# Patient Record
Sex: Female | Born: 1935 | Race: White | Hispanic: No | Smoking: Never smoker
Health system: Southern US, Community
[De-identification: ages and names within clinical notes are randomized; demographics above are authoritative.]

## PROBLEM LIST (undated history)

## (undated) DIAGNOSIS — I499 Cardiac arrhythmia, unspecified: Secondary | ICD-10-CM

## (undated) DIAGNOSIS — I509 Heart failure, unspecified: Secondary | ICD-10-CM

## (undated) DIAGNOSIS — F32A Depression, unspecified: Secondary | ICD-10-CM

## (undated) DIAGNOSIS — I10 Essential (primary) hypertension: Secondary | ICD-10-CM

---

## 2020-09-06 ENCOUNTER — Other Ambulatory Visit: Payer: Self-pay

## 2020-09-06 ENCOUNTER — Emergency Department: Payer: Medicare HMO

## 2020-09-06 ENCOUNTER — Emergency Department
Admission: EM | Admit: 2020-09-06 | Discharge: 2020-09-06 | Disposition: A | Discharge status: Home / Self Care | Payer: Medicare HMO | Attending: Emergency Medicine | Admitting: Emergency Medicine

## 2020-09-06 DIAGNOSIS — I480 Paroxysmal atrial fibrillation: Secondary | ICD-10-CM | POA: Insufficient documentation

## 2020-09-06 DIAGNOSIS — R0789 Other chest pain: Secondary | ICD-10-CM | POA: Diagnosis present

## 2020-09-06 LAB — CBC
HCT: 43.3 % (ref 36.0–46.0)
Hemoglobin: 14.2 g/dL (ref 12.0–15.0)
MCH: 30.4 pg (ref 26.0–34.0)
MCHC: 32.8 g/dL (ref 30.0–36.0)
MCV: 92.7 fL (ref 80.0–100.0)
Platelets: 144 10*3/uL — ABNORMAL LOW (ref 150–400)
RBC: 4.67 MIL/uL (ref 3.87–5.11)
RDW: 13.2 % (ref 11.5–15.5)
WBC: 6.3 10*3/uL (ref 4.0–10.5)
nRBC: 0 % (ref 0.0–0.2)

## 2020-09-06 LAB — TROPONIN I (HIGH SENSITIVITY)
Troponin I (High Sensitivity): 39 ng/L — ABNORMAL HIGH (ref <18)
Troponin I (High Sensitivity): 37 ng/L — ABNORMAL HIGH (ref <18)

## 2020-09-06 LAB — BASIC METABOLIC PANEL
Anion gap: 9 (ref 5–15)
BUN: 15 mg/dL (ref 8–23)
CO2: 28 mmol/L (ref 22–32)
Calcium: 9.4 mg/dL (ref 8.9–10.3)
Chloride: 104 mmol/L (ref 98–111)
Creatinine, Ser: 0.52 mg/dL (ref 0.44–1.00)
GFR, Estimated: >60 mL/min (ref >60)
Glucose, Bld: 116 mg/dL — ABNORMAL HIGH (ref 70–99)
Potassium: 4.1 mmol/L (ref 3.5–5.1)
Sodium: 141 mmol/L (ref 135–145)

## 2020-09-06 NOTE — ED Triage Notes (Signed)
Pt come with c/o CP and SOB. Pt states this started today. Pt states she was trying to get dressed and couldn't breath

## 2020-09-06 NOTE — ED Provider Notes (Signed)
[facility] Emergency Department Provider Note  ____________________________________________  Time seen: Approximately 10:44 PM  I have reviewed the triage vital signs and the nursing notes.   HISTORY  Chief Complaint Shortness of Breath    HPI Alexandria Goodman is a 85 y.o. female with a past history of CHF, paroxysmal atrial fibrillation who comes ED complaining of intermittent episodes of chest tightness and shortness of breath over the past several days.  Earlier today it was more intense so she came to the ED, but she reports it has resolved and she feels back to normal, no chest pain or shortness of breath currently.  Denies any recent exertional symptoms.  No dyspnea on exertion or orthopnea.  She does take her torsemide intermittently, has not taken her carvedilol since yesterday.  Had been off of Eliquis for stroke prevention but just received it in the mail today .  Reports that she checks her weight every day.  Her weight has been at her usual range of 190 to 194 pounds.  In the past with significant CHF symptoms she is weight is much is 210 pounds.     History reviewed. No pertinent past medical history.   There are no problems to display for this patient.    History reviewed. No pertinent surgical history.   Prior to Admission medications   Not on File     Allergies Patient has no allergy information on record.   No family history on file.  Social History    Review of Systems  Constitutional:   No fever or chills.  ENT:   No sore throat. No rhinorrhea. Cardiovascular: Positive chest pain without syncope. Respiratory: Positive shortness of breath without cough. Gastrointestinal:   Negative for abdominal pain, vomiting and diarrhea.  Musculoskeletal:   Chronic lower extremity swelling All other systems reviewed and are negative except as documented above in ROS and  HPI.  ____________________________________________   PHYSICAL EXAM:  VITAL SIGNS: ED Triage Vitals [09/06/20 1724]  Enc Vitals Group     BP (!) 152/73     Pulse Rate 86     Resp 18     Temp 98.5 F (36.9 C)     Temp src      SpO2 100 %     Weight      Height      Head Circumference      Peak Flow      Pain Score 7     Pain Loc      Pain Edu?      Excl. in GC?     Vital signs reviewed, nursing assessments reviewed.   Constitutional:   Alert and oriented. Non-toxic appearance. Eyes:   Conjunctivae are normal. EOMI. PERRL. ENT      Head:   Normocephalic and atraumatic.      Nose:   Wearing a mask.      Mouth/Throat:   Wearing a mask.      Neck:   No meningismus. Full ROM. Hematological/Lymphatic/Immunilogical:   No cervical lymphadenopathy. Cardiovascular:   RRR. Symmetric bilateral radial and DP pulses.  No murmurs. Cap refill less than 2 seconds. Respiratory:   Normal respiratory effort without tachypnea/retractions. Breath sounds are clear and equal bilaterally. No wheezes/rales/rhonchi. Gastrointestinal:   Soft and nontender. Non distended. There is no CVA tenderness.  No rebound, rigidity, or guarding. Genitourinary:   deferred Musculoskeletal:   Normal range of motion in all extremities. No joint effusions.  No lower extremity tenderness.  Trace pitting edema bilateral lower extremities.  Symmetric calf circumference. Neurologic:   Normal speech and language.  Motor grossly intact. No acute focal neurologic deficits are appreciated.  Skin:    Skin is warm, dry and intact. No rash noted.  No petechiae, purpura, or bullae.  ____________________________________________    LABS (pertinent positives/negatives) (all labs ordered are listed, but only abnormal results are displayed) Labs Reviewed  BASIC METABOLIC PANEL - Abnormal; Notable for the following components:      Result Value   Glucose, Bld 116 (*)    All other components within normal limits  CBC -  Abnormal; Notable for the following components:   Platelets 144 (*)    All other components within normal limits  TROPONIN I (HIGH SENSITIVITY) - Abnormal; Notable for the following components:   Troponin I (High Sensitivity) 39 (*)    All other components within normal limits  TROPONIN I (HIGH SENSITIVITY) - Abnormal; Notable for the following components:   Troponin I (High Sensitivity) 37 (*)    All other components within normal limits   ____________________________________________   EKG  Interpreted by me Normal sinus rhythm rate of 81.  Normal axis and intervals.  Poor R wave progression.  Normal ST segments and T waves.  No ischemic changes.  ____________________________________________    RADIOLOGY  DG Chest 2 View  Result Date: 09/06/2020 CLINICAL DATA:  85 year old female with chest pain. EXAM: CHEST - 2 VIEW COMPARISON:  None FINDINGS: Small bilateral pleural effusions with associated bibasilar atelectasis. Pneumonia is not excluded. Clinical correlation is recommended. There is chronic interstitial coarsening and mild bronchitic changes. No pneumothorax. Mild cardiomegaly. No acute osseous pathology. IMPRESSION: Small bilateral pleural effusions and bibasilar atelectasis versus infiltrate. Electronically Signed   By: Elgie Collard M.D.   On: 09/06/2020 18:04    ____________________________________________   PROCEDURES Procedures  ____________________________________________  DIFFERENTIAL DIAGNOSIS   Paroxysmal atrial fibrillation, pulmonary edema, pleural effusion, non-STEMI, GERD  CLINICAL IMPRESSION / ASSESSMENT AND PLAN / ED COURSE  Medications ordered in the ED: Medications - No data to display  Pertinent labs & imaging results that were available during my care of the patient were reviewed by me and considered in my medical decision making (see chart for details).  Alexandria Goodman was evaluated in Emergency Department on 09/06/2020 for the symptoms  described in the history of present illness. She was evaluated in the context of the global COVID-19 pandemic, which necessitated consideration that the patient might be at risk for infection with the SARS-CoV-2 virus that causes COVID-19. Institutional protocols and algorithms that pertain to the evaluation of patients at risk for COVID-19 are in a state of rapid change based on information released by regulatory bodies including the CDC and federal and state organizations. These policies and algorithms were followed during the patient's care in the ED.   Patient presents with atypical chest symptoms.  Not exertional.  Due to medication noncompliance, I suspect paroxysmal atrial fibrillation which has since resolved.  She has her medications at home including Eliquis and will resume them.  Encouraged her to follow-up with heart failure clinic for continued monitoring of her medication regimen and symptoms since she has just moved to the [facility] area and currently does not have a cardiologist in the area.      ____________________________________________   FINAL CLINICAL IMPRESSION(S) / ED DIAGNOSES    Final diagnoses:  Atypical chest pain  Paroxysmal atrial fibrillation [facility])     ED Discharge Orders  None      Portions of this note were generated with dragon dictation software. Dictation errors may occur despite best attempts at proofreading.   Sharman Cheek, MD 09/06/20 2248

## 2020-09-06 NOTE — ED Triage Notes (Signed)
Emergency Medicine Provider Triage Evaluation Note  Alexandria Goodman , a 85 y.o. female  was evaluated in triage.  Pt complains of chest pain and shortness of breath. Symptoms started after she became very upset last night. She believes she is in a-fib. No chest pain currently.  Review of Systems  Positive: Shortness of breath Negative: Chest pain currently.  Physical Exam  There were no vitals taken for this visit. Gen:   Awake, no distress  Speaking in full sentences. HEENT:  Atraumatic  Resp:  Normal effort  Cardiac:  Normal rate  Abd:   Nondistended, nontender  MSK:   Moves extremities without difficulty  Neuro:  Speech clear   Medical Decision Making  Medically screening exam initiated at 5:21 PM.  Appropriate orders placed.  Alexandria Goodman was informed that the remainder of the evaluation will be completed by another provider, this initial triage assessment does not replace that evaluation, and the importance of remaining in the ED until their evaluation is complete.  Clinical Impression   Anxiety   Chinita Pester, FNP 09/06/20 Lora Paula, MD 09/06/20 2244

## 2020-09-06 NOTE — ED Triage Notes (Signed)
Pt comes into the ED via EMS from home with c/o having chest pain after having an argument with a family member today. Non compliant with home meds,   HR100 100%RA 170/90 [id number]

## 2020-09-06 NOTE — ED Notes (Signed)
Pt verbalized understanding of d/c instructions at this time. Pt denies further questions. Pt assisted to car by wheelchair by this RN. Pt in NAD at this time.

## 2020-09-06 NOTE — Discharge Instructions (Addendum)
Please continue to take all of your medications as prescribed and follow up with the heart failure clinic

## 2020-09-07 ENCOUNTER — Telehealth: Payer: Self-pay | Admitting: Family

## 2020-09-07 NOTE — Telephone Encounter (Signed)
LVM in attempt to schedule patient with a new patient CHF Clinic appointment after receiving a referral from the ED.   Vinette Crites, NT

## 2020-10-18 ENCOUNTER — Emergency Department
Admission: EM | Admit: 2020-10-18 | Discharge: 2020-10-18 | Disposition: A | Discharge status: Home / Self Care | Payer: Medicare HMO | Attending: Emergency Medicine | Admitting: Emergency Medicine

## 2020-10-18 ENCOUNTER — Emergency Department: Payer: Medicare HMO

## 2020-10-18 ENCOUNTER — Other Ambulatory Visit: Payer: Self-pay

## 2020-10-18 DIAGNOSIS — W1809XA Striking against other object with subsequent fall, initial encounter: Secondary | ICD-10-CM | POA: Insufficient documentation

## 2020-10-18 DIAGNOSIS — M25531 Pain in right wrist: Secondary | ICD-10-CM | POA: Diagnosis not present

## 2020-10-18 DIAGNOSIS — M79641 Pain in right hand: Secondary | ICD-10-CM | POA: Diagnosis not present

## 2020-10-18 DIAGNOSIS — R0781 Pleurodynia: Secondary | ICD-10-CM | POA: Insufficient documentation

## 2020-10-18 DIAGNOSIS — S0990XA Unspecified injury of head, initial encounter: Secondary | ICD-10-CM | POA: Insufficient documentation

## 2020-10-18 DIAGNOSIS — W19XXXA Unspecified fall, initial encounter: Secondary | ICD-10-CM

## 2020-10-18 NOTE — ED Provider Notes (Signed)
[facility]-EMERGENCY DEPARTMENT  ____________________________________________  Time seen: Approximately 4:18 PM  I have reviewed the triage vital signs and the nursing notes.   HISTORY  Chief Complaint Fall   Historian Patient     HPI Alexandria Goodman is a 85 y.o. female presents to the emergency department after a mechanical fall.  Patient was at her dentist office when she tripped on a rug.  She does report that she hit her head but does not recall loss of consciousness.  She is having right wrist pain and right hand pain as well as right-sided rib pain.  She denies chest tightness, shortness of breath, nausea, vomiting or abdominal pain.  She has been able to ambulate since injury occurred.  No abrasions or lacerations.   History reviewed. No pertinent past medical history.   Immunizations up to date:  Yes.     History reviewed. No pertinent past medical history.  There are no problems to display for this patient.   History reviewed. No pertinent surgical history.  Prior to Admission medications   Not on File    Allergies Patient has no allergy information on record.  History reviewed. No pertinent family history.  Social History Social History   Tobacco Use  . Smoking status: Unknown If Ever Smoked     Review of Systems  Constitutional: No fever/chills Eyes:  No discharge ENT: No upper respiratory complaints. Respiratory: no cough. No SOB/ use of accessory muscles to breath Gastrointestinal:   No nausea, no vomiting.  No diarrhea.  No constipation. Musculoskeletal: Patient has right wrist and hand pain.  Skin: Negative for rash, abrasions, lacerations, ecchymosis.   ____________________________________________   PHYSICAL EXAM:  VITAL SIGNS: ED Triage Vitals  Enc Vitals Group     BP 10/18/20 1545 (!) 141/65     Pulse Rate 10/18/20 1545 71     Resp 10/18/20 1545 18     Temp 10/18/20 1545 98.2 F (36.8 C)     Temp Source 10/18/20 1545 Oral      SpO2 10/18/20 1545 98 %     Weight 10/18/20 1546 188 lb (85.3 kg)     Height 10/18/20 1546 4\' 6"  (1.372 m)     Head Circumference --      Peak Flow --      Pain Score 10/18/20 1546 4     Pain Loc --      Pain Edu? --      Excl. in GC? --      Constitutional: Alert and oriented. Well appearing and in no acute distress. Eyes: Conjunctivae are normal. PERRL. EOMI. Head: Atraumatic. ENT:      Nose: No congestion/rhinnorhea.      Mouth/Throat: Mucous membranes are moist.  Neck: No stridor.  Full range of motion.  No midline C-spine tenderness to palpation. Cardiovascular: Normal rate, regular rhythm. Normal S1 and S2.  Good peripheral circulation. Respiratory: Normal respiratory effort without tachypnea or retractions. Lungs CTAB. Good air entry to the bases with no decreased or absent breath sounds Gastrointestinal: Bowel sounds x 4 quadrants. Soft and nontender to palpation. No guarding or rigidity. No distention. Musculoskeletal: Patient performs full range of motion at the right wrist.  She does have some minimal soft tissue swelling along the dorsal aspect of the right wrist and hand. Neurologic:  Normal for age. No gross focal neurologic deficits are appreciated.  Skin:  Skin is warm, dry and intact. No rash noted. Psychiatric: Mood and affect are normal for age. Speech and behavior  are normal.   ____________________________________________   LABS (all labs ordered are listed, but only abnormal results are displayed)  Labs Reviewed - No data to display ____________________________________________  EKG   ____________________________________________  RADIOLOGY Geraldo Pitter, personally viewed and evaluated these images (plain radiographs) as part of my medical decision making, as well as reviewing the written report by the radiologist.    DG Ribs Unilateral W/Chest Right  Result Date: 10/18/2020 CLINICAL DATA:  Recent fall with right-sided rib pain, initial encounter  EXAM: RIGHT RIBS AND CHEST - 3+ VIEW COMPARISON:  09/06/2020 FINDINGS: Cardiac shadow is enlarged but stable. Aortic calcifications are again seen. The lungs are clear without focal infiltrate or sizable effusion. No acute fracture is identified. IMPRESSION: No evidence of acute right rib fracture. Electronically Signed   By: Alcide Clever M.D.   On: 10/18/2020 17:03   DG Wrist Complete Right  Result Date: 10/18/2020 CLINICAL DATA:  Recent fall with wrist pain, initial encounter EXAM: RIGHT WRIST - COMPLETE 3+ VIEW COMPARISON:  None. FINDINGS: No acute fracture or dislocation is noted. Degenerative changes are noted in the first [facility] joint as well as in the lateral aspect of the second carpal row. No soft tissue abnormality is seen. IMPRESSION: Degenerative change without acute abnormality. Electronically Signed   By: Alcide Clever M.D.   On: 10/18/2020 17:02   CT Head Wo Contrast  Result Date: 10/18/2020 CLINICAL DATA:  Status post fall. EXAM: CT HEAD WITHOUT CONTRAST TECHNIQUE: Contiguous axial images were obtained from the base of the skull through the vertex without intravenous contrast. COMPARISON:  None. FINDINGS: Brain: There is mild cerebral atrophy with widening of the extra-axial spaces and ventricular dilatation. There are areas of decreased attenuation within the white matter tracts of the supratentorial brain, consistent with microvascular disease changes. Small, chronic bilateral basal ganglia lacunar infarcts are noted. Vascular: No hyperdense vessel or unexpected calcification. Skull: Normal. Negative for fracture or focal lesion. Sinuses/Orbits: Mild bilateral ethmoid sinus mucosal thickening is seen. Other: None. IMPRESSION: 1. Mild generalized cerebral atrophy. 2. Small, chronic bilateral basal ganglia lacunar infarcts. 3. No acute intracranial abnormality. Electronically Signed   By: Aram Candela M.D.   On: 10/18/2020 16:42   CT Cervical Spine Wo Contrast  Result Date:  10/18/2020 CLINICAL DATA:  Status post fall. EXAM: CT CERVICAL SPINE WITHOUT CONTRAST TECHNIQUE: Multidetector CT imaging of the cervical spine was performed without intravenous contrast. Multiplanar CT image reconstructions were also generated. COMPARISON:  None. FINDINGS: Alignment: Normal. Skull base and vertebrae: No acute fracture. No primary bone lesion or focal pathologic process. Soft tissues and spinal canal: No prevertebral fluid or swelling. No visible canal hematoma. Disc levels: Moderate to marked severity endplate sclerosis is seen at the levels of C4-C5, C5-C6 and C6-C7. Mild endplate sclerosis is seen at the levels of C3-C4 and C7-T1. There is moderate severity narrowing of the anterior atlantoaxial articulation. Marked severity intervertebral disc space narrowing is seen at the levels of C3-C4, C4-C5, C5-C6 and C6-C7. Moderate severity intervertebral disc space narrowing is seen at the level of C7-T1. Bilateral moderate severity multilevel facet joint hypertrophy is noted. Upper chest: Negative. Other: None. IMPRESSION: 1. Marked severity multilevel degenerative changes, as described above. 2. No evidence of an acute fracture or subluxation. Electronically Signed   By: Aram Candela M.D.   On: 10/18/2020 16:45   DG Hand Complete Right  Result Date: 10/18/2020 CLINICAL DATA:  Recent fall with hand pain, initial encounter EXAM: RIGHT HAND - COMPLETE  3+ VIEW COMPARISON:  None. FINDINGS: Mild degenerative changes are noted in the carpal bones. Additionally interphalangeal degenerative changes are seen. No acute fracture or dislocation is noted. No soft tissue abnormality is seen. IMPRESSION: Mild degenerative change without acute abnormality. Electronically Signed   By: Alcide Clever M.D.   On: 10/18/2020 17:04    ____________________________________________    PROCEDURES  Procedure(s) performed:     Procedures     Medications - No data to  display   ____________________________________________   INITIAL IMPRESSION / ASSESSMENT AND PLAN / ED COURSE  Pertinent labs & imaging results that were available during my care of the patient were reviewed by me and considered in my medical decision making (see chart for details).       Assessment and Plan:  Fall:  85 year old female presents to the emergency department after a mechanical fall.  Vital signs were reassuring at triage.  On physical exam, patient was alert, active and nontoxic-appearing.  Patient had no neuro deficits on exam.  No signs of intracranial bleed or skull fracture on CT of the head or cervical.  There is no bony abnormality on x-rays of the right wrist, right hand or right ribs.  Tylenol was recommended for discomfort.  Return precautions were given to return with new or worsening symptoms.  ____________________________________________  FINAL CLINICAL IMPRESSION(S) / ED DIAGNOSES  Final diagnoses:  Fall, initial encounter      NEW MEDICATIONS STARTED DURING THIS VISIT:  ED Discharge Orders    None          This chart was dictated using voice recognition software/Dragon. Despite best efforts to proofread, errors can occur which can change the meaning. Any change was purely unintentional.     Orvil Feil, PA-C 10/18/20 1859    Phineas Semen, MD 10/18/20 2159

## 2020-10-18 NOTE — ED Triage Notes (Signed)
Patient reported via EMS from [employer] Patient experienced mechanical fall. Reports pain to right wrist and right rib.

## 2020-10-18 NOTE — ED Triage Notes (Signed)
Pt comes into the ED via EMS from [facility], mechanical fall , tripped on carpet, right wrist and right rib pain,  143/66 77HR 96%RA

## 2020-10-18 NOTE — ED Notes (Signed)
Provided discharge instructions. Verbalized understanding. Wheeled to lobby to wait for ride.

## 2020-11-02 ENCOUNTER — Emergency Department: Payer: Medicare HMO

## 2020-11-02 ENCOUNTER — Observation Stay
Admit: 2020-11-02 | Discharge: 2020-11-02 | Disposition: A | Discharge status: Home / Self Care | Payer: Medicare HMO | Attending: Internal Medicine | Admitting: Internal Medicine

## 2020-11-02 ENCOUNTER — Other Ambulatory Visit: Payer: Self-pay

## 2020-11-02 ENCOUNTER — Encounter: Payer: Self-pay | Admitting: Internal Medicine

## 2020-11-02 ENCOUNTER — Observation Stay
Admission: EM | Admit: 2020-11-02 | Discharge: 2020-11-02 | Disposition: A | Discharge status: Home / Self Care | Payer: Medicare HMO | Attending: Internal Medicine | Admitting: Internal Medicine

## 2020-11-02 DIAGNOSIS — I10 Essential (primary) hypertension: Secondary | ICD-10-CM | POA: Diagnosis not present

## 2020-11-02 DIAGNOSIS — Z7901 Long term (current) use of anticoagulants: Secondary | ICD-10-CM | POA: Diagnosis not present

## 2020-11-02 DIAGNOSIS — Z20822 Contact with and (suspected) exposure to covid-19: Secondary | ICD-10-CM | POA: Diagnosis not present

## 2020-11-02 DIAGNOSIS — N183 Chronic kidney disease, stage 3 unspecified: Secondary | ICD-10-CM | POA: Diagnosis not present

## 2020-11-02 DIAGNOSIS — F4325 Adjustment disorder with mixed disturbance of emotions and conduct: Secondary | ICD-10-CM | POA: Diagnosis not present

## 2020-11-02 DIAGNOSIS — I130 Hypertensive heart and chronic kidney disease with heart failure and stage 1 through stage 4 chronic kidney disease, or unspecified chronic kidney disease: Secondary | ICD-10-CM | POA: Diagnosis not present

## 2020-11-02 DIAGNOSIS — R079 Chest pain, unspecified: Secondary | ICD-10-CM

## 2020-11-02 DIAGNOSIS — R7989 Other specified abnormal findings of blood chemistry: Secondary | ICD-10-CM

## 2020-11-02 DIAGNOSIS — Z79899 Other long term (current) drug therapy: Secondary | ICD-10-CM | POA: Diagnosis not present

## 2020-11-02 DIAGNOSIS — I5032 Chronic diastolic (congestive) heart failure: Secondary | ICD-10-CM | POA: Diagnosis not present

## 2020-11-02 DIAGNOSIS — R45851 Suicidal ideations: Secondary | ICD-10-CM

## 2020-11-02 DIAGNOSIS — R259 Unspecified abnormal involuntary movements: Secondary | ICD-10-CM | POA: Diagnosis not present

## 2020-11-02 DIAGNOSIS — F321 Major depressive disorder, single episode, moderate: Secondary | ICD-10-CM | POA: Diagnosis not present

## 2020-11-02 DIAGNOSIS — E66813 Obesity, class 3: Secondary | ICD-10-CM | POA: Diagnosis present

## 2020-11-02 DIAGNOSIS — R778 Other specified abnormalities of plasma proteins: Secondary | ICD-10-CM | POA: Insufficient documentation

## 2020-11-02 DIAGNOSIS — I480 Paroxysmal atrial fibrillation: Secondary | ICD-10-CM | POA: Diagnosis present

## 2020-11-02 DIAGNOSIS — Z046 Encounter for general psychiatric examination, requested by authority: Secondary | ICD-10-CM

## 2020-11-02 DIAGNOSIS — R0789 Other chest pain: Principal | ICD-10-CM | POA: Diagnosis present

## 2020-11-02 DIAGNOSIS — Z7982 Long term (current) use of aspirin: Secondary | ICD-10-CM | POA: Diagnosis not present

## 2020-11-02 DIAGNOSIS — I350 Nonrheumatic aortic (valve) stenosis: Secondary | ICD-10-CM

## 2020-11-02 HISTORY — DX: Heart failure, unspecified: I50.9

## 2020-11-02 HISTORY — DX: Depression, unspecified: F32.A

## 2020-11-02 HISTORY — DX: Essential (primary) hypertension: I10

## 2020-11-02 HISTORY — DX: Cardiac arrhythmia, unspecified: I49.9

## 2020-11-02 LAB — URINALYSIS, COMPLETE (UACMP) WITH MICROSCOPIC
Bilirubin Urine: NEGATIVE
Glucose, UA: NEGATIVE mg/dL
Hgb urine dipstick: NEGATIVE
Ketones, ur: NEGATIVE mg/dL
Leukocytes,Ua: NEGATIVE
Nitrite: NEGATIVE
Protein, ur: NEGATIVE mg/dL
Specific Gravity, Urine: 1.014 (ref 1.005–1.030)
pH: 7 (ref 5.0–8.0)

## 2020-11-02 LAB — CBC
HCT: 48.4 % — ABNORMAL HIGH (ref 36.0–46.0)
Hemoglobin: 15.6 g/dL — ABNORMAL HIGH (ref 12.0–15.0)
MCH: 30.4 pg (ref 26.0–34.0)
MCHC: 32.2 g/dL (ref 30.0–36.0)
MCV: 94.2 fL (ref 80.0–100.0)
Platelets: 176 10*3/uL (ref 150–400)
RBC: 5.14 MIL/uL — ABNORMAL HIGH (ref 3.87–5.11)
RDW: 13.2 % (ref 11.5–15.5)
WBC: 8.2 10*3/uL (ref 4.0–10.5)
nRBC: 0 % (ref 0.0–0.2)

## 2020-11-02 LAB — ECHOCARDIOGRAM COMPLETE
AR max vel: 2.03 cm2
AV Area VTI: 1.81 cm2
AV Area mean vel: 1.97 cm2
AV Mean grad: 2 mmHg
AV Peak grad: 3.7 mmHg
Ao pk vel: 0.96 m/s
Area-P 1/2: 3.08 cm2
Height: 54 in
MV VTI: 1.3 cm2
S' Lateral: 2.74 cm
Weight: 3008 oz

## 2020-11-02 LAB — URINE DRUG SCREEN, QUALITATIVE (ARMC ONLY)
Amphetamines, Ur Screen: NOT DETECTED
Barbiturates, Ur Screen: NOT DETECTED
Benzodiazepine, Ur Scrn: NOT DETECTED
Cannabinoid 50 Ng, Ur ~~LOC~~: NOT DETECTED
Cocaine Metabolite,Ur ~~LOC~~: NOT DETECTED
MDMA (Ecstasy)Ur Screen: NOT DETECTED
Methadone Scn, Ur: NOT DETECTED
Opiate, Ur Screen: NOT DETECTED
Phencyclidine (PCP) Ur S: NOT DETECTED
Tricyclic, Ur Screen: NOT DETECTED

## 2020-11-02 LAB — COMPREHENSIVE METABOLIC PANEL
ALT: 48 U/L — ABNORMAL HIGH (ref 0–44)
AST: 66 U/L — ABNORMAL HIGH (ref 15–41)
Albumin: 3.9 g/dL (ref 3.5–5.0)
Alkaline Phosphatase: 200 U/L — ABNORMAL HIGH (ref 38–126)
Anion gap: 8 (ref 5–15)
BUN: 15 mg/dL (ref 8–23)
CO2: 26 mmol/L (ref 22–32)
Calcium: 9.6 mg/dL (ref 8.9–10.3)
Chloride: 106 mmol/L (ref 98–111)
Creatinine, Ser: 0.64 mg/dL (ref 0.44–1.00)
GFR, Estimated: >60 mL/min (ref >60)
Glucose, Bld: 133 mg/dL — ABNORMAL HIGH (ref 70–99)
Potassium: 3.8 mmol/L (ref 3.5–5.1)
Sodium: 140 mmol/L (ref 135–145)
Total Bilirubin: 0.9 mg/dL (ref 0.3–1.2)
Total Protein: 8 g/dL (ref 6.5–8.1)

## 2020-11-02 LAB — LIPASE, BLOOD: Lipase: 37 U/L (ref 11–51)

## 2020-11-02 LAB — RESP PANEL BY RT-PCR (FLU A&B, COVID) ARPGX2
Influenza A by PCR: NEGATIVE
Influenza B by PCR: NEGATIVE
SARS Coronavirus 2 by RT PCR: NEGATIVE

## 2020-11-02 LAB — TROPONIN I (HIGH SENSITIVITY)
Troponin I (High Sensitivity): 41 ng/L — ABNORMAL HIGH (ref <18)
Troponin I (High Sensitivity): 47 ng/L — ABNORMAL HIGH (ref <18)
Troponin I (High Sensitivity): 55 ng/L — ABNORMAL HIGH (ref <18)

## 2020-11-02 LAB — ACETAMINOPHEN LEVEL: Acetaminophen (Tylenol), Serum: <10 ug/mL — ABNORMAL LOW (ref 10–30)

## 2020-11-02 LAB — SALICYLATE LEVEL: Salicylate Lvl: <7 mg/dL — ABNORMAL LOW (ref 7.0–30.0)

## 2020-11-02 LAB — ETHANOL: Alcohol, Ethyl (B): <10 mg/dL (ref <10)

## 2020-11-02 LAB — BRAIN NATRIURETIC PEPTIDE: B Natriuretic Peptide: 603.2 pg/mL — ABNORMAL HIGH (ref 0.0–100.0)

## 2020-11-02 MED ORDER — ACETAMINOPHEN 325 MG PO TABS: 650.0000 mg | ORAL_TABLET | ORAL | Status: DC | PRN

## 2020-11-02 MED ORDER — ONDANSETRON HCL 4 MG/2ML IJ SOLN: 4.0000 mg | Freq: Four times a day (QID) | INTRAMUSCULAR | Status: DC | PRN

## 2020-11-02 MED ORDER — ASPIRIN 81 MG PO CHEW
324.0000 mg | CHEWABLE_TABLET | Freq: Once | ORAL | Status: AC
  Administered 2020-11-02: 324 mg via ORAL
  Filled 2020-11-02: qty 4

## 2020-11-02 MED ORDER — DOCUSATE SODIUM 100 MG PO CAPS: 300.0000 mg | ORAL_CAPSULE | Freq: Every day | ORAL | Status: DC

## 2020-11-02 MED ORDER — BISACODYL 5 MG PO TBEC: 5.0000 mg | DELAYED_RELEASE_TABLET | Freq: Every day | ORAL | Status: DC | PRN

## 2020-11-02 MED ORDER — DULOXETINE HCL 60 MG PO CPEP
60.0000 mg | ORAL_CAPSULE | Freq: Every day | ORAL | Status: DC
  Administered 2020-11-02: 60 mg via ORAL
  Filled 2020-11-02: qty 1

## 2020-11-02 MED ORDER — VITAMIN D 25 MCG (1000 UNIT) PO TABS
4000.0000 [IU] | ORAL_TABLET | Freq: Every day | ORAL | Status: DC
  Administered 2020-11-02: 4000 [IU] via ORAL
  Filled 2020-11-02: qty 4

## 2020-11-02 MED ORDER — ASPIRIN 81 MG PO CHEW: 81.0000 mg | CHEWABLE_TABLET | Freq: Every day | ORAL | Status: DC

## 2020-11-02 MED ORDER — OXYCODONE-ACETAMINOPHEN 5-325 MG PO TABS
2.0000 | ORAL_TABLET | Freq: Three times a day (TID) | ORAL | Status: DC | PRN
Start: 2020-11-02 — End: 2020-11-02

## 2020-11-02 MED ORDER — LOSARTAN POTASSIUM 50 MG PO TABS
50.0000 mg | ORAL_TABLET | Freq: Every day | ORAL | Status: DC
  Administered 2020-11-02: 50 mg via ORAL
  Filled 2020-11-02: qty 1

## 2020-11-02 MED ORDER — ADULT MULTIVITAMIN W/MINERALS CH
1.0000 | ORAL_TABLET | Freq: Every day | ORAL | Status: DC
  Administered 2020-11-02: 1 via ORAL
  Filled 2020-11-02: qty 1

## 2020-11-02 MED ORDER — BUMETANIDE 1 MG PO TABS
2.0000 mg | ORAL_TABLET | Freq: Every day | ORAL | Status: DC
  Filled 2020-11-02: qty 2

## 2020-11-02 MED ORDER — CARVEDILOL 6.25 MG PO TABS
6.2500 mg | ORAL_TABLET | Freq: Every day | ORAL | Status: DC
  Administered 2020-11-02: 6.25 mg via ORAL
  Filled 2020-11-02: qty 1

## 2020-11-02 MED ORDER — APIXABAN 5 MG PO TABS
5.0000 mg | ORAL_TABLET | Freq: Two times a day (BID) | ORAL | Status: DC
  Administered 2020-11-02: 5 mg via ORAL
  Filled 2020-11-02: qty 1

## 2020-11-02 NOTE — ED Triage Notes (Signed)
Pt brought in under IVC states has been arguing with daughter and police committed her. Daughter per paper work stated that pt has made threats to kill herself by taking pills. Pt denies any SI or HI.
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
## 2020-11-02 NOTE — ED Notes (Signed)
Pt unable to provide urine sample at this time. Pt placed in new depends

## 2020-11-02 NOTE — Consult Note (Signed)
[facility] Face-to-Face Psychiatry Consult   Reason for Consult: Consult to follow-up with this 85 year old woman brought into the hospital under IVC but currently on the medical service Referring Physician: Agbata Patient Identification: Alexandria Goodman MRN:  [id number] Principal Diagnosis: Chest pain Diagnosis:  Principal Problem:   Chest pain Active Problems:   AF (paroxysmal atrial fibrillation) (HCC)   Essential hypertension   Obesity, Class III, BMI 40-49.9 (morbid obesity) (HCC)   Adjustment disorder with mixed disturbance of emotions and conduct   Total Time spent with patient: 45 minutes  Subjective:   Alexandria Goodman is a 85 y.o. female patient admitted with "this is what my daughter has done".  HPI: Patient seen chart reviewed.  Reviewed psychiatry note from last night.  85 year old woman brought in under IVC that alleges that she had made suicidal statements.  Patient tells me the same story that is reported in the psychiatric evaluation last night.  Patient says that she has been living with her daughter and multiple other people and what sounds like a chaotic situation for several months.  Yesterday she and her daughter got into a verbal dispute and according to the patient the daughter became angry.  Patient admits that she started walking down the street because she just felt she had to get out of the house and also admits that she may have at some point said that she did not want to live anymore but absolutely denies that she had any suicidal thoughts or intent.  Denies any suicidal ideation now.  Denies having been depressed.  Denies psychotic symptoms.  Patient seems sincere and appropriate in her history right now.  No evidence of thought disorder.  Past Psychiatric History: Past history of depression and what sounds like a brief hospitalization at [facility] many years ago when her husband died but has not seen anyone for mental health treatment since then and has never  tried to kill her self.  Risk to Self:   Risk to Others:   Prior Inpatient Therapy:   Prior Outpatient Therapy:    Past Medical History:  Past Medical History:  Diagnosis Date  . CHF (congestive heart failure) (HCC)   . Depression   . Dysrhythmia   . Hypertension    No past surgical history on file. Family History:  Family History  Family history unknown: Yes   Family Psychiatric  History: Denies any Social History:  Social History   Substance and Sexual Activity  Alcohol Use Never     Social History   Substance and Sexual Activity  Drug Use Never    Social History   Socioeconomic History  . Marital status: Widowed    Spouse name: Not on file  . Number of children: Not on file  . Years of education: Not on file  . Highest education level: Not on file  Occupational History  . Not on file  Tobacco Use  . Smoking status: Never Smoker  . Smokeless tobacco: Not on file  Substance and Sexual Activity  . Alcohol use: Never  . Drug use: Never  . Sexual activity: Not on file  Other Topics Concern  . Not on file  Social History Narrative  . Not on file   Social Determinants of Health   Financial Resource Strain: Not on file  Food Insecurity: Not on file  Transportation Needs: Not on file  Physical Activity: Not on file  Stress: Not on file  Social Connections: Not on file   Additional Social History:  Allergies:   Allergies  Allergen Reactions  . Alendronate Other (See Comments)    Hand Pain Other reaction(s): Other Hand pain  Hand pain  Hand Pain Other reaction(s): Other Hand pain    . Dilaudid [Hydromorphone] Rash  . Monosodium Glutamate Anaphylaxis  . Alendronate Sodium   . Levofloxacin Hives  . Lisinopril Cough  . Morphine And Related Hives  . Pravastatin Itching  . Solifenacin Other (See Comments)    unknown  . Tradjenta [Linagliptin]     unknown  . Trospium     unknown  . Augmentin [Amoxicillin-Pot Clavulanate]     Yeast  infection  . Sulfa Antibiotics Rash    Labs:  Results for orders placed or performed during the hospital encounter of 11/02/20 (from the past 48 hour(s))  CBC     Status: Abnormal   Collection Time: 11/02/20 12:27 AM  Result Value Ref Range   WBC 8.2 4.0 - 10.5 K/uL   RBC 5.14 (H) 3.87 - 5.11 MIL/uL   Hemoglobin 15.6 (H) 12.0 - 15.0 g/dL   HCT 16.1 (H) 09.6 - 04.5 %   MCV 94.2 80.0 - 100.0 fL   MCH 30.4 26.0 - 34.0 pg   MCHC 32.2 30.0 - 36.0 g/dL   RDW 40.9 81.1 - 91.4 %   Platelets 176 150 - 400 K/uL   nRBC 0.0 0.0 - 0.2 %    Comment: Performed at [facility], [address] [place], [place] [address]  Comprehensive metabolic panel     Status: Abnormal   Collection Time: 11/02/20 12:27 AM  Result Value Ref Range   Sodium 140 135 - 145 mmol/L   Potassium 3.8 3.5 - 5.1 mmol/L   Chloride 106 98 - 111 mmol/L   CO2 26 22 - 32 mmol/L   Glucose, Bld 133 (H) 70 - 99 mg/dL    Comment: Glucose reference range applies only to samples taken after fasting for at least 8 hours.   BUN 15 8 - 23 mg/dL   Creatinine, Ser 6.21 0.44 - 1.00 mg/dL   Calcium 9.6 8.9 - 30.8 mg/dL   Total Protein 8.0 6.5 - 8.1 g/dL   Albumin 3.9 3.5 - 5.0 g/dL   AST 66 (H) 15 - 41 U/L   ALT 48 (H) 0 - 44 U/L   Alkaline Phosphatase 200 (H) 38 - 126 U/L   Total Bilirubin 0.9 0.3 - 1.2 mg/dL   GFR, Estimated >65 >78 mL/min    Comment: (NOTE) Calculated using the CKD-EPI Creatinine Equation (2021)    Anion gap 8 5 - 15    Comment: Performed at [facility], [address] [place], [place] [address]  Ethanol     Status: None   Collection Time: 11/02/20 12:27 AM  Result Value Ref Range   Alcohol, Ethyl (B) <10 <10 mg/dL    Comment: (NOTE) Lowest detectable limit for serum alcohol is 10 mg/dL.  For medical purposes only. Performed at [facility], [address] [place], [place] [address]   Acetaminophen level     Status: Abnormal   Collection Time: 11/02/20 12:27 AM   Result Value Ref Range   Acetaminophen (Tylenol), Serum <10 (L) 10 - 30 ug/mL    Comment: (NOTE) Therapeutic concentrations vary significantly. A range of 10-30 ug/mL  may be an effective concentration for many patients. However, some  are best treated at concentrations outside of this range. Acetaminophen concentrations >150 ug/mL at 4 hours after ingestion  and >50  ug/mL at 12 hours after ingestion are often associated with  toxic reactions.  Performed at [facility], [address] [place], [place] [address]   Salicylate level     Status: Abnormal   Collection Time: 11/02/20 12:27 AM  Result Value Ref Range   Salicylate Lvl <7.0 (L) 7.0 - 30.0 mg/dL    Comment: Performed at [facility], [address] [place], [place] [address]  Troponin I (High Sensitivity)     Status: Abnormal   Collection Time: 11/02/20 12:27 AM  Result Value Ref Range   Troponin I (High Sensitivity) 41 (H) <18 ng/L    Comment: (NOTE) Elevated high sensitivity troponin I (hsTnI) values and significant  changes across serial measurements may suggest ACS but many other  chronic and acute conditions are known to elevate hsTnI results.  Refer to the "Links" section for chest pain algorithms and additional  guidance. Performed at [facility], [address] [place], [place] [address]   Brain natriuretic peptide     Status: Abnormal   Collection Time: 11/02/20 12:27 AM  Result Value Ref Range   B Natriuretic Peptide 603.2 (H) 0.0 - 100.0 pg/mL    Comment: Performed at [facility], [address] [place], [place] [address]  Resp Panel by RT-PCR (Flu A&B, Covid) Nasopharyngeal Swab     Status: None   Collection Time: 11/02/20  1:34 AM   Specimen: Nasopharyngeal Swab; Nasopharyngeal(NP) swabs in vial transport medium  Result Value Ref Range   SARS Coronavirus 2 by RT PCR NEGATIVE NEGATIVE    Comment: (NOTE) SARS-CoV-2 target nucleic acids are NOT  DETECTED.  The SARS-CoV-2 RNA is generally detectable in upper respiratory specimens during the acute phase of infection. The lowest concentration of SARS-CoV-2 viral copies this assay can detect is 138 copies/mL. A negative result does not preclude SARS-Cov-2 infection and should not be used as the sole basis for treatment or other patient management decisions. A negative result may occur with  improper specimen collection/handling, submission of specimen other than nasopharyngeal swab, presence of viral mutation(s) within the areas targeted by this assay, and inadequate number of viral copies(<138 copies/mL). A negative result must be combined with clinical observations, patient history, and epidemiological information. The expected result is Negative.  Fact Sheet for Patients:  BloggerCourse.com  Fact Sheet for Healthcare Providers:  SeriousBroker.it  This test is no t yet approved or cleared by the [place] FDA and  has been authorized for detection and/or diagnosis of SARS-CoV-2 by FDA under an Emergency Use Authorization (EUA). This EUA will remain  in effect (meaning this test can be used) for the duration of the COVID-19 declaration under Section 564(b)(1) of the Act, 21 U.S.C.section 360bbb-3(b)(1), unless the authorization is terminated  or revoked sooner.       Influenza A by PCR NEGATIVE NEGATIVE   Influenza B by PCR NEGATIVE NEGATIVE    Comment: (NOTE) The Xpert Xpress SARS-CoV-2/FLU/RSV plus assay is intended as an aid in the diagnosis of influenza from Nasopharyngeal swab specimens and should not be used as a sole basis for treatment. Nasal washings and aspirates are unacceptable for Xpert Xpress SARS-CoV-2/FLU/RSV testing.  Fact Sheet for Patients: BloggerCourse.com  Fact Sheet for Healthcare Providers: SeriousBroker.it  This test is not yet approved or  cleared by the [place] FDA and has been authorized for detection and/or diagnosis of SARS-CoV-2 by FDA under an Emergency Use Authorization (EUA). This EUA will remain in effect (meaning this test  can be used) for the duration of the COVID-19 declaration under Section 564(b)(1) of the Act, 21 U.S.C. section 360bbb-3(b)(1), unless the authorization is terminated or revoked.  Performed at [facility], [address] [place], [place] [address]   Troponin I (High Sensitivity)     Status: Abnormal   Collection Time: 11/02/20  3:02 AM  Result Value Ref Range   Troponin I (High Sensitivity) 47 (H) <18 ng/L    Comment: (NOTE) Elevated high sensitivity troponin I (hsTnI) values and significant  changes across serial measurements may suggest ACS but many other  chronic and acute conditions are known to elevate hsTnI results.  Refer to the "Links" section for chest pain algorithms and additional  guidance. Performed at [facility], [address] [place], [place] [address]   Lipase, blood     Status: None   Collection Time: 11/02/20  3:02 AM  Result Value Ref Range   Lipase 37 11 - 51 U/L    Comment: Performed at [facility], [address] [place], [place] [address]  Troponin I (High Sensitivity)     Status: Abnormal   Collection Time: 11/02/20  4:52 AM  Result Value Ref Range   Troponin I (High Sensitivity) 55 (H) <18 ng/L    Comment: (NOTE) Elevated high sensitivity troponin I (hsTnI) values and significant  changes across serial measurements may suggest ACS but many other  chronic and acute conditions are known to elevate hsTnI results.  Refer to the "Links" section for chest pain algorithms and additional  guidance. Performed at [facility], [address] [place], [place] [address]   Urinalysis, Complete w Microscopic Urine, Clean Catch     Status: Abnormal   Collection Time: 11/02/20 10:12 AM  Result Value Ref Range    Color, Urine YELLOW (A) YELLOW   APPearance CLEAR (A) CLEAR   Specific Gravity, Urine 1.014 1.005 - 1.030   pH 7.0 5.0 - 8.0   Glucose, UA NEGATIVE NEGATIVE mg/dL   Hgb urine dipstick NEGATIVE NEGATIVE   Bilirubin Urine NEGATIVE NEGATIVE   Ketones, ur NEGATIVE NEGATIVE mg/dL   Protein, ur NEGATIVE NEGATIVE mg/dL   Nitrite NEGATIVE NEGATIVE   Leukocytes,Ua NEGATIVE NEGATIVE   RBC / HPF 0-5 0 - 5 RBC/hpf   WBC, UA 0-5 0 - 5 WBC/hpf   Bacteria, UA RARE (A) NONE SEEN   Squamous Epithelial / LPF 0-5 0 - 5    Comment: Performed at [facility], [address] [place], [place] [address]  Urine Drug Screen, Qualitative ([facility] only)     Status: None   Collection Time: 11/02/20 10:12 AM  Result Value Ref Range   Tricyclic, Ur Screen NONE DETECTED NONE DETECTED   Amphetamines, Ur Screen NONE DETECTED NONE DETECTED   MDMA (Ecstasy)Ur Screen NONE DETECTED NONE DETECTED   Cocaine Metabolite,Ur [place] NONE DETECTED NONE DETECTED   Opiate, Ur Screen NONE DETECTED NONE DETECTED   Phencyclidine (PCP) Ur S NONE DETECTED NONE DETECTED   Cannabinoid 50 Ng, Ur [place] NONE DETECTED NONE DETECTED   Barbiturates, Ur Screen NONE DETECTED NONE DETECTED   Benzodiazepine, Ur Scrn NONE DETECTED NONE DETECTED   Methadone Scn, Ur NONE DETECTED NONE DETECTED    Comment: (NOTE) Tricyclics + metabolites, urine    Cutoff 1000 ng/mL Amphetamines + metabolites, urine  Cutoff 1000 ng/mL MDMA (Ecstasy), urine              Cutoff 500 ng/mL Cocaine Metabolite, urine  Cutoff 300 ng/mL Opiate + metabolites, urine        Cutoff 300 ng/mL Phencyclidine (PCP), urine         Cutoff 25 ng/mL Cannabinoid, urine                 Cutoff 50 ng/mL Barbiturates + metabolites, urine  Cutoff 200 ng/mL Benzodiazepine, urine              Cutoff 200 ng/mL Methadone, urine                   Cutoff 300 ng/mL  The urine drug screen provides only a preliminary, unconfirmed analytical test result and should not be used for  non-medical purposes. Clinical consideration and professional judgment should be applied to any positive drug screen result due to possible interfering substances. A more specific alternate chemical method must be used in order to obtain a confirmed analytical result. Gas chromatography / mass spectrometry (GC/MS) is the preferred confirm atory method. Performed at [facility], [address], [place], [place] [address]     Current Facility-Administered Medications  Medication Dose Route Frequency Provider Last Rate Last Admin  . acetaminophen (TYLENOL) tablet 650 mg  650 mg Oral Q4H PRN Agbata, Tochukwu, MD      . apixaban (ELIQUIS) tablet 5 mg  5 mg Oral BID Agbata, Tochukwu, MD   5 mg at 11/02/20 1051  . [START ON 11/03/2020] aspirin chewable tablet 81 mg  81 mg Oral Daily Agbata, Tochukwu, MD      . bisacodyl (DULCOLAX) EC tablet 5 mg  5 mg Oral Daily PRN Agbata, Tochukwu, MD      . bumetanide (BUMEX) tablet 2 mg  2 mg Oral Daily Agbata, Tochukwu, MD      . carvedilol (COREG) tablet 6.25 mg  6.25 mg Oral Daily Agbata, Tochukwu, MD   6.25 mg at 11/02/20 0942  . cholecalciferol (VITAMIN D3) tablet 4,000 Units  4,000 Units Oral Daily Agbata, Tochukwu, MD   4,000 Units at 11/02/20 0942  . docusate sodium (COLACE) capsule 300 mg  300 mg Oral QHS Agbata, Tochukwu, MD      . DULoxetine (CYMBALTA) DR capsule 60 mg  60 mg Oral Daily Agbata, Tochukwu, MD   60 mg at 11/02/20 0942  . losartan (COZAAR) tablet 50 mg  50 mg Oral Daily Agbata, Tochukwu, MD   50 mg at 11/02/20 0942  . multivitamin with minerals tablet 1 tablet  1 tablet Oral Daily Agbata, Tochukwu, MD   1 tablet at 11/02/20 1051  . ondansetron (ZOFRAN) injection 4 mg  4 mg Intravenous Q6H PRN Agbata, Tochukwu, MD      . oxyCODONE-acetaminophen (PERCOCET/ROXICET) 5-325 MG per tablet 2 tablet  2 tablet Oral Q8H PRN Agbata, Tochukwu, MD       Current Outpatient Medications  Medication Sig Dispense Refill  . apixaban (ELIQUIS)  5 MG TABS tablet Take 5 mg by mouth daily.    Marland Kitchen aspirin 81 MG chewable tablet Take 81 mg by mouth daily.    . bisacodyl (DULCOLAX) 5 MG EC tablet Take 5 mg by mouth daily as needed.    . bumetanide (BUMEX) 2 MG tablet bumetanide 2 mg tablet   2 mg by oral route.    . carvedilol (COREG) 6.25 MG tablet Take 6.25 mg by mouth daily.    . Cholecalciferol 50 MCG (2000 UT) CAPS Take 4,000 Units by mouth daily.    . diclofenac Sodium (VOLTAREN) 1 % GEL Apply  2 g topically 4 (four) times daily.    Marland Kitchen docusate sodium (COLACE) 100 MG capsule Take 300 mg by mouth at bedtime.    . DULoxetine (CYMBALTA) 60 MG capsule Take 60 mg by mouth daily.    Marland Kitchen losartan (COZAAR) 50 MG tablet Take 50 mg by mouth daily.    . Multiple Vitamin (MULTI-VITAMIN) tablet Take 1 tablet by mouth daily.    Marland Kitchen oxyCODONE-acetaminophen (PERCOCET) 10-325 MG tablet Take 1 tablet by mouth 3 (three) times daily as needed.      Musculoskeletal: Strength & Muscle Tone: within normal limits Gait & Station: unsteady Patient leans: N/A            Psychiatric Specialty Exam:  Presentation  General Appearance: Appropriate for Environment  Eye Contact:Good  Speech:Clear and Coherent  Speech Volume:Increased  Handedness:Right   Mood and Affect  Mood:Anxious; Depressed; Hopeless; Irritable  Affect:Depressed; Congruent   Thought Process  Thought Processes:Coherent  Descriptions of Associations:Intact  Orientation:Full (Time, Place and Person)  Thought Content:Logical; WDL  History of Schizophrenia/Schizoaffective disorder:No  Duration of Psychotic Symptoms:No data recorded Hallucinations:Hallucinations: None  Ideas of Reference:None  Suicidal Thoughts:Suicidal Thoughts: No  Homicidal Thoughts:Homicidal Thoughts: No   Sensorium  Memory:Immediate Good; Recent Good; Remote Good  Judgment:Fair  Insight:Fair   Executive Functions  Concentration:Good  Attention Span:Good  Recall:Good  Fund of  Knowledge:Good  Language:Good   Psychomotor Activity  Psychomotor Activity:Psychomotor Activity: Decreased   Assets  Assets:Communication Skills; Desire for Improvement; Housing; Physical Health; Resilience; Social Support   Sleep  Sleep:Sleep: Good   Physical Exam: Physical Exam Vitals and nursing note reviewed.  Constitutional:      Appearance: Normal appearance.  HENT:     Head: Normocephalic and atraumatic.     Mouth/Throat:     Pharynx: Oropharynx is clear.  Eyes:     Pupils: Pupils are equal, round, and reactive to light.  Cardiovascular:     Rate and Rhythm: Normal rate and regular rhythm.  Pulmonary:     Effort: Pulmonary effort is normal.     Breath sounds: Normal breath sounds.  Abdominal:     General: Abdomen is flat.     Palpations: Abdomen is soft.  Musculoskeletal:        General: Normal range of motion.  Skin:    General: Skin is warm and dry.  Neurological:     General: No focal deficit present.     Mental Status: She is alert. Mental status is at baseline.  Psychiatric:        Attention and Perception: Attention normal.        Mood and Affect: Mood is anxious.        Speech: Speech normal.        Behavior: Behavior normal.        Thought Content: Thought content normal.        Cognition and Memory: Cognition normal.        Judgment: Judgment is impulsive.    Review of Systems  Constitutional: Negative.   HENT: Negative.   Eyes: Negative.   Respiratory: Negative.   Cardiovascular: Negative.   Gastrointestinal: Negative.   Musculoskeletal: Negative.   Skin: Negative.   Neurological: Negative.   Psychiatric/Behavioral: Negative for depression, hallucinations, memory loss, substance abuse and suicidal ideas. The patient does not have insomnia.    Blood pressure (!) 171/94, pulse 77, temperature 99.7 F (37.6 C), temperature source Oral, resp. rate 16, height 4\' 6"  (1.372 m), weight 85.3 kg, SpO2 93 %.  Body mass index is 45.33  kg/m.  Treatment Plan Summary: Plan 85 year old woman insist that her daughter filed these commitment papers as something of a retaliation or in order to get the patient out of the house.  Patient absolutely denies any suicidal thoughts or intent.  Denies depression.  Her story has been consistent and rational and does not appear to be obviously false.  All things considered the patient's story sounds believable.  No evidence then at this point that she meets commitment criteria.  Discontinue IVC.  Patient in fact does not appear to even meet criteria for depression.  No suggestion of medication.  Encourage patient to calm down so she can deal with her daughter if she goes back home and also to calm down and cooperate with the medical team here.  No psychiatric follow-up indicated however.  Disposition: No evidence of imminent risk to self or others at present.   Patient does not meet criteria for psychiatric inpatient admission. Supportive therapy provided about ongoing stressors. Discussed crisis plan, support from social network, calling 911, coming to the Emergency Department, and calling Suicide Hotline.  Mordecai RasmussenJohn Shakaya Bhullar, MD 11/02/2020 1:34 PM

## 2020-11-02 NOTE — ED Notes (Signed)
Shift report received from [facility]

## 2020-11-02 NOTE — ED Notes (Signed)
Breakfast tray given. °
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
## 2020-11-02 NOTE — ED Notes (Signed)
This tech is sitting 1:1 IVC. Pt is alert and oriented at time. No other needs found at this moment.

## 2020-11-02 NOTE — ED Notes (Signed)
UA sent to lab.  Denture care provided. Toothbrush and toothpaste provided to pt. Pt performed oral care.  This tech remains 1:1 IVC w/ pt. No other needs found at this moment.

## 2020-11-02 NOTE — TOC Transition Note (Signed)
Transition of Care [facility]) - CM/SW Discharge Note   Patient Details  Name: Alexandria Goodman MRN: [id number] Date of Birth: [birth year]  [facility] CM/SW Contact:  Marina Goodell Phone Number: [phone] 11/02/2020, 3:56 PM   Clinical Narrative:     CSW spoke with ED RN about request from Attending to speak with patient.  ED RN stated the patient has been psych cleared and told ED RN that she had money to pay for a hotel room.  CSW attempted to contact the patient and left voicemail.  CSW contacted Attending and updated her that patient has capacity, has been psych cleared and has stated she is able to pay for her hotel room.  CSW stated the most I would be able to do is send her to a homeless shelter if the patient did not have a home to go to or didn't want to go to a hotel.  Attending acknowledged CSW message.        Patient Goals and CMS Choice        Discharge Placement                       Discharge Plan and Services                                     Social Determinants of Health (SDOH) Interventions SDOH Interventions for the Following Domains: Housing,Stress   Readmission Risk Interventions No flowsheet data found.
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
## 2020-11-02 NOTE — ED Notes (Signed)
Patient is resting comfortably. 
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
## 2020-11-02 NOTE — ED Notes (Signed)
Rounding on patient. Patient upset about IVC status. Spent 10 minutes in detail explaining process. Patient still unsatisfied with status. Patient still had personal belongings, bag and phone. Delegated collecting personal items to CNA/Sitter in room.

## 2020-11-02 NOTE — ED Notes (Signed)
Pt moved to ED 14 due to medical admission. Pt with sitter, Beth EDT at this time. Pt provided phone and purse to try and call her bank.   Report given to [facility], Charity fundraiser.
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
## 2020-11-02 NOTE — ED Notes (Signed)
Pt given orange juice and grape juice at this time.

## 2020-11-02 NOTE — Progress Notes (Signed)
*  PRELIMINARY RESULTS* Echocardiogram 2D Echocardiogram has been performed.  Cristela Blue 11/02/2020, 1:14 PM

## 2020-11-02 NOTE — ED Notes (Signed)
[place]ap[place], MD at bedside.

## 2020-11-02 NOTE — ED Notes (Signed)
Rounding attending at bedside at this moment.

## 2020-11-02 NOTE — ED Notes (Signed)
Report given to Laura, RN.

## 2020-11-02 NOTE — Discharge Instructions (Signed)
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
## 2021-01-25 ENCOUNTER — Observation Stay: Payer: Medicare HMO

## 2021-01-25 ENCOUNTER — Other Ambulatory Visit: Payer: Self-pay

## 2021-01-25 ENCOUNTER — Observation Stay
Admission: EM | Admit: 2021-01-25 | Discharge: 2021-02-01 | Disposition: A | Discharge status: Home / Self Care | Payer: Medicare HMO | Attending: Internal Medicine | Admitting: Internal Medicine

## 2021-01-25 DIAGNOSIS — T679XXA Effect of heat and light, unspecified, initial encounter: Secondary | ICD-10-CM

## 2021-01-25 DIAGNOSIS — R55 Syncope and collapse: Principal | ICD-10-CM | POA: Insufficient documentation

## 2021-01-25 DIAGNOSIS — Z9181 History of falling: Secondary | ICD-10-CM | POA: Insufficient documentation

## 2021-01-25 DIAGNOSIS — I10 Essential (primary) hypertension: Secondary | ICD-10-CM | POA: Diagnosis present

## 2021-01-25 DIAGNOSIS — Z79899 Other long term (current) drug therapy: Secondary | ICD-10-CM | POA: Diagnosis not present

## 2021-01-25 DIAGNOSIS — Z20822 Contact with and (suspected) exposure to covid-19: Secondary | ICD-10-CM | POA: Insufficient documentation

## 2021-01-25 DIAGNOSIS — I350 Nonrheumatic aortic (valve) stenosis: Secondary | ICD-10-CM

## 2021-01-25 DIAGNOSIS — Z7901 Long term (current) use of anticoagulants: Secondary | ICD-10-CM | POA: Insufficient documentation

## 2021-01-25 DIAGNOSIS — T675XXA Heat exhaustion, unspecified, initial encounter: Secondary | ICD-10-CM | POA: Insufficient documentation

## 2021-01-25 DIAGNOSIS — Z5900 Homelessness unspecified: Secondary | ICD-10-CM | POA: Diagnosis not present

## 2021-01-25 DIAGNOSIS — I5033 Acute on chronic diastolic (congestive) heart failure: Secondary | ICD-10-CM | POA: Insufficient documentation

## 2021-01-25 DIAGNOSIS — E876 Hypokalemia: Secondary | ICD-10-CM | POA: Insufficient documentation

## 2021-01-25 DIAGNOSIS — Y900 Blood alcohol level of less than 20 mg/100 ml: Secondary | ICD-10-CM | POA: Insufficient documentation

## 2021-01-25 DIAGNOSIS — Z7982 Long term (current) use of aspirin: Secondary | ICD-10-CM | POA: Insufficient documentation

## 2021-01-25 DIAGNOSIS — I480 Paroxysmal atrial fibrillation: Secondary | ICD-10-CM | POA: Insufficient documentation

## 2021-01-25 DIAGNOSIS — F4325 Adjustment disorder with mixed disturbance of emotions and conduct: Secondary | ICD-10-CM | POA: Diagnosis not present

## 2021-01-25 DIAGNOSIS — E669 Obesity, unspecified: Secondary | ICD-10-CM | POA: Diagnosis present

## 2021-01-25 DIAGNOSIS — I110 Hypertensive heart disease with heart failure: Secondary | ICD-10-CM | POA: Diagnosis not present

## 2021-01-25 LAB — URINALYSIS, COMPLETE (UACMP) WITH MICROSCOPIC
Bilirubin Urine: NEGATIVE
Glucose, UA: NEGATIVE mg/dL
Hgb urine dipstick: NEGATIVE
Ketones, ur: NEGATIVE mg/dL
Leukocytes,Ua: NEGATIVE
Nitrite: NEGATIVE
Protein, ur: NEGATIVE mg/dL
Specific Gravity, Urine: 1.009 (ref 1.005–1.030)
pH: 7 (ref 5.0–8.0)

## 2021-01-25 LAB — MAGNESIUM: Magnesium: 1.8 mg/dL (ref 1.7–2.4)

## 2021-01-25 LAB — BLOOD GAS, VENOUS
Acid-Base Excess: 10.8 mmol/L — ABNORMAL HIGH (ref 0.0–2.0)
Bicarbonate: 36.3 mmol/L — ABNORMAL HIGH (ref 20.0–28.0)
O2 Saturation: 91.2 %
Patient temperature: 37
pCO2, Ven: 51 mmHg (ref 44.0–60.0)
pH, Ven: 7.46 — ABNORMAL HIGH (ref 7.250–7.430)
pO2, Ven: 58 mmHg — ABNORMAL HIGH (ref 32.0–45.0)

## 2021-01-25 LAB — URINE DRUG SCREEN, QUALITATIVE (ARMC ONLY)
Amphetamines, Ur Screen: NOT DETECTED
Barbiturates, Ur Screen: NOT DETECTED
Benzodiazepine, Ur Scrn: NOT DETECTED
Cannabinoid 50 Ng, Ur ~~LOC~~: NOT DETECTED
Cocaine Metabolite,Ur ~~LOC~~: NOT DETECTED
MDMA (Ecstasy)Ur Screen: NOT DETECTED
Methadone Scn, Ur: NOT DETECTED
Opiate, Ur Screen: NOT DETECTED
Phencyclidine (PCP) Ur S: NOT DETECTED
Tricyclic, Ur Screen: NOT DETECTED

## 2021-01-25 LAB — BASIC METABOLIC PANEL
Anion gap: 5 (ref 5–15)
BUN: 15 mg/dL (ref 8–23)
CO2: 34 mmol/L — ABNORMAL HIGH (ref 22–32)
Calcium: 8.7 mg/dL — ABNORMAL LOW (ref 8.9–10.3)
Chloride: 101 mmol/L (ref 98–111)
Creatinine, Ser: 0.72 mg/dL (ref 0.44–1.00)
GFR, Estimated: >60 mL/min (ref >60)
Glucose, Bld: 169 mg/dL — ABNORMAL HIGH (ref 70–99)
Potassium: 3.7 mmol/L (ref 3.5–5.1)
Sodium: 140 mmol/L (ref 135–145)

## 2021-01-25 LAB — TROPONIN I (HIGH SENSITIVITY)
Troponin I (High Sensitivity): 25 ng/L — ABNORMAL HIGH (ref <18)
Troponin I (High Sensitivity): 26 ng/L — ABNORMAL HIGH (ref <18)
Troponin I (High Sensitivity): 24 ng/L — ABNORMAL HIGH (ref <18)

## 2021-01-25 LAB — CBC
HCT: 35.9 % — ABNORMAL LOW (ref 36.0–46.0)
Hemoglobin: 12 g/dL (ref 12.0–15.0)
MCH: 32.3 pg (ref 26.0–34.0)
MCHC: 33.4 g/dL (ref 30.0–36.0)
MCV: 96.8 fL (ref 80.0–100.0)
Platelets: 123 10*3/uL — ABNORMAL LOW (ref 150–400)
RBC: 3.71 MIL/uL — ABNORMAL LOW (ref 3.87–5.11)
RDW: 13.3 % (ref 11.5–15.5)
WBC: 5.4 10*3/uL (ref 4.0–10.5)
nRBC: 0 % (ref 0.0–0.2)

## 2021-01-25 LAB — PHOSPHORUS: Phosphorus: 3.5 mg/dL (ref 2.5–4.6)

## 2021-01-25 LAB — BRAIN NATRIURETIC PEPTIDE: B Natriuretic Peptide: 923 pg/mL — ABNORMAL HIGH (ref 0.0–100.0)

## 2021-01-25 LAB — HEPATIC FUNCTION PANEL
ALT: 37 U/L (ref 0–44)
AST: 62 U/L — ABNORMAL HIGH (ref 15–41)
Albumin: 3.2 g/dL — ABNORMAL LOW (ref 3.5–5.0)
Alkaline Phosphatase: 176 U/L — ABNORMAL HIGH (ref 38–126)
Bilirubin, Direct: 0.3 mg/dL — ABNORMAL HIGH (ref 0.0–0.2)
Indirect Bilirubin: 0.8 mg/dL (ref 0.3–0.9)
Total Bilirubin: 1.1 mg/dL (ref 0.3–1.2)
Total Protein: 6.1 g/dL — ABNORMAL LOW (ref 6.5–8.1)

## 2021-01-25 LAB — CK: Total CK: 51 U/L (ref 38–234)

## 2021-01-25 LAB — ETHANOL: Alcohol, Ethyl (B): <10 mg/dL (ref <10)

## 2021-01-25 MED ORDER — ACETAMINOPHEN 325 MG PO TABS
650.0000 mg | ORAL_TABLET | Freq: Four times a day (QID) | ORAL | Status: DC | PRN
  Administered 2021-01-26: 650 mg via ORAL
  Filled 2021-01-25: qty 2

## 2021-01-25 MED ORDER — APIXABAN 5 MG PO TABS
5.0000 mg | ORAL_TABLET | Freq: Every day | ORAL | Status: DC
  Administered 2021-01-26 – 2021-02-01 (×7): 5 mg via ORAL
  Filled 2021-01-25 (×7): qty 1

## 2021-01-25 MED ORDER — ACETAMINOPHEN 650 MG RE SUPP: 650.0000 mg | Freq: Four times a day (QID) | RECTAL | Status: DC | PRN

## 2021-01-25 MED ORDER — CARVEDILOL 6.25 MG PO TABS
6.2500 mg | ORAL_TABLET | Freq: Every day | ORAL | Status: DC
  Administered 2021-01-26 – 2021-02-01 (×8): 6.25 mg via ORAL
  Filled 2021-01-25 (×8): qty 1

## 2021-01-25 MED ORDER — ASPIRIN 81 MG PO CHEW
81.0000 mg | CHEWABLE_TABLET | Freq: Every day | ORAL | Status: DC
  Administered 2021-01-26 – 2021-02-01 (×8): 81 mg via ORAL
  Filled 2021-01-25 (×8): qty 1

## 2021-01-25 MED ORDER — FUROSEMIDE 10 MG/ML IJ SOLN
40.0000 mg | Freq: Every day | INTRAMUSCULAR | Status: DC
  Administered 2021-01-26: 40 mg via INTRAVENOUS
  Filled 2021-01-25: qty 4

## 2021-01-25 MED ORDER — OXYCODONE-ACETAMINOPHEN 7.5-325 MG PO TABS
1.0000 | ORAL_TABLET | Freq: Four times a day (QID) | ORAL | Status: DC | PRN
  Administered 2021-01-26 – 2021-02-01 (×18): 1 via ORAL
  Filled 2021-01-25 (×19): qty 1

## 2021-01-25 MED ORDER — SODIUM CHLORIDE 0.9 % IV SOLN
75.0000 mL/h | INTRAVENOUS | Status: DC
  Administered 2021-01-25: 75 mL/h via INTRAVENOUS

## 2021-01-25 NOTE — ED Notes (Signed)
Ultrasound at bedside

## 2021-01-25 NOTE — ED Triage Notes (Addendum)
Pt comes into the ED via EMS from [facility] on [address] in [place]. Pt is living in her car and woke in the car over heated feeling dizziness.   CBG161 97%RA HR86 Temp 97

## 2021-01-25 NOTE — ED Notes (Signed)
Patient hard of hearing. Patient reports living in her car since April. Patient reports she had a syncopal episode today. Patient reports she was in her car without the [facility] one when she "passed out." Patient reports chronic pain as well. Patient denies SOB, chest pain, etc.

## 2021-01-25 NOTE — ED Notes (Signed)
Pt given denture cup 

## 2021-01-25 NOTE — ED Triage Notes (Addendum)
Pt brought in via ems from [facility].  Pt is living in her car.  Pt had a syncopal episode and feels dizzy.  No chest pain or sob.   Pt ambulates with a cane.  Pt alert   speech clear.

## 2021-01-25 NOTE — ED Notes (Signed)
hospitalist in room  

## 2021-01-25 NOTE — H&P (Signed)
Alexandria Goodman:[id number] DOB: Jan 27, 1936 DOA: 01/25/2021     PCP: Oneita Hurt, No   Outpatient Specialists:   CARDS:  Autumn Messing, PA-C [facility]    Patient arrived to ER on 01/25/21 at 1711 Referred by Attending Chesley Noon, MD   Patient coming from: homeless  Chief Complaint:   Chief Complaint  Patient presents with   Heat Exposure   Near Syncope    HPI: Alexandria Goodman is a 85 y.o. female with medical history significant of   HTN, p A.fib on Eliquis, diastolic CHF, obesity, aortic stenosis    Presented with  syncope She was eating lunch and felt hot she got into her car that was hot too, the [facility] is working but old and not strong. She she eventually passed out and woke up in her car no lightheadedness, no head injury no CP or SOB she layed down on the bench in front of [employer] and someone called EMS Homeless lives in her car In the past has been seen by [facility] cardiology for severe aortic stenosis at that time declined further care secondary to being homeless.  Patient states her daughter had a lot of other responsibilities the plan for patient was to seek alternative housing but she was not able to do so on time and instead ended up living in her car for the past 2 months Patient been compliant with all her medications.  She has been taking her Eliquis. She has been taking her Bumex. But secondary to sitting in the car all day long her legs have been dangling down she is not able to raise her legs.  This has increased some swelling with past few weeks since the temperature has been rising.   Pt have had some recent tooth extraction and has some bruising arounf hr mouth ut reprots good PO intake  Has  been vaccinated against COVID     Initial COVID TEST   in house  PCR testing  Pending  Lab Results  Component Value Date   SARSCOV2NAA NEGATIVE 11/02/2020   Regarding pertinent Chronic problems:      HTN on Coreg, Cozaar    chronic CHF diastolic/ - last echo 12/16/2020: TTE-EF 60-65%, mild concentric LVH, severe aortic stenosis. AVA by VTI is 0.93 cm. Mean gradient across AV is 49 mmHg     Morbid obesity-   BMI Readings from Last 1 Encounters:  01/25/21 45.33 kg/m     A. Fib -  - CHA2DS2 vas score 6     current  on anticoagulation with Eliquis,           -  Rate control:  Currently controlled with Coreg             While in ER: Trop 25 - 26 BP stable No chest pain or shortness of breath BNP elevated 900    ED Triage Vitals  Enc Vitals Group     BP 01/25/21 1724 (!) 158/78     Pulse Rate 01/25/21 1724 78     Resp 01/25/21 1724 18     Temp 01/25/21 1725 98.2 F (36.8 C)     Temp Source 01/25/21 1725 Oral     SpO2 01/25/21 1724 97 %     Weight 01/25/21 1725 188 lb (85.3 kg)     Height 01/25/21 1725  (1.372 m)     Head Circumference --      Peak Flow --  Pain Score 01/25/21 1725 0     Pain Loc --      Pain Edu? --      Excl. in GC? --   TMAX(24)@     _________________________________________ Significant initial  Findings: Abnormal Labs Reviewed  BASIC METABOLIC PANEL - Abnormal; Notable for the following components:      Result Value   CO2 34 (*)    Glucose, Bld 169 (*)    Calcium 8.7 (*)    All other components within normal limits  CBC - Abnormal; Notable for the following components:   RBC 3.71 (*)    HCT 35.9 (*)    Platelets 123 (*)    All other components within normal limits  TROPONIN I (HIGH SENSITIVITY) - Abnormal; Notable for the following components:   Troponin I (High Sensitivity) 25 (*)    All other components within normal limits   ____________________________________________ Ordered    CXR -cardiomegaly and mild edema     _________________________ Troponin  26-25 ECG: Ordered Personally reviewed by me showing: HR : 82 Rhythm:  NSR, Sinus tachycardia  LVH  no evidence of ischemic changes QTC 479   The recent clinical data is shown  below. Vitals:   01/25/21 1724 01/25/21 1725 01/25/21 2015  BP: (!) 158/78  (!) 155/75  Pulse: 78  81  Resp: 18  12  Temp:  98.2 F (36.8 C)   TempSrc:  Oral   SpO2: 97%  99%  Weight:  85.3 kg   Height:  4\' 6"  (1.372 m)      WBC     Component Value Date/Time   WBC 5.4 01/25/2021 1728        UA  no evidence of UTI     Urine analysis:    Component Value Date/Time   COLORURINE YELLOW (A) 01/25/2021 2132   APPEARANCEUR CLEAR (A) 01/25/2021 2132   LABSPEC 1.009 01/25/2021 2132   PHURINE 7.0 01/25/2021 2132   GLUCOSEU NEGATIVE 01/25/2021 2132   HGBUR NEGATIVE 01/25/2021 2132   BILIRUBINUR NEGATIVE 01/25/2021 2132   KETONESUR NEGATIVE 01/25/2021 2132   PROTEINUR NEGATIVE 01/25/2021 2132   NITRITE NEGATIVE 01/25/2021 2132   LEUKOCYTESUR NEGATIVE 01/25/2021 2132    _______________________________________________ Hospitalist was called for admission for syncope  The following Work up has been ordered so far:  Orders Placed This Encounter  Procedures   SARS CORONAVIRUS 2 (TAT 6-24 HRS) Nasopharyngeal Nasopharyngeal Swab   Basic metabolic panel   CBC   Consult to hospitalist   ED EKG     Following Medications were ordered in ER: Medications - No data to display      Consult Orders  (From admission, onward)           Start     Ordered   01/25/21 2041  Consult to hospitalist  Once       Provider:  (Not yet assigned)  Question Answer Comment  Place call to: [id number]   Reason for Consult Admit      01/25/21 2040             OTHER Significant initial  Findings:  labs showing:    Recent Labs  Lab 01/25/21 1728  NA 140  K 3.7  CO2 34*  GLUCOSE 169*  BUN 15  CREATININE 0.72  CALCIUM 8.7*    Cr  stable,    Lab Results  Component Value Date   CREATININE 0.72 01/25/2021   CREATININE 0.64 11/02/2020   CREATININE 0.52 09/06/2020  No results for input(s): AST, ALT, ALKPHOS, BILITOT, PROT, ALBUMIN in the last 168 hours. Lab Results   Component Value Date   CALCIUM 8.7 (L) 01/25/2021       Plt: Lab Results  Component Value Date   PLT 123 (L) 01/25/2021        COVID-19 Labs  No results for input(s): DDIMER, FERRITIN, LDH, CRP in the last 72 hours.  Lab Results  Component Value Date   SARSCOV2NAA NEGATIVE 11/02/2020      Venous  Blood Gas result:  pH 7.46 pCO2 51       Recent Labs  Lab 01/25/21 1728  WBC 5.4  HGB 12.0  HCT 35.9*  MCV 96.8  PLT 123*    HG/HCT  stable,      Component Value Date/Time   HGB 12.0 01/25/2021 1728   HCT 35.9 (L) 01/25/2021 1728   MCV 96.8 01/25/2021 1728      Cardiac Panel (last 3 results) Recent Labs    01/25/21 1728  CKTOTAL 51     BNP (last 3 results) Recent Labs    11/02/20 0027 01/25/21 1728  BNP 603.2* 923.0*           Cultures: No results found for: SDES, SPECREQUEST, CULT, REPTSTATUS   Radiological Exams on Admission: No results found. _______________________________________________________________________________________________________ Latest  Blood pressure (!) 155/75, pulse 81, temperature 98.2 F (36.8 C), temperature source Oral, resp. rate 12, height  (1.372 m), weight 85.3 kg, SpO2 99 %.   Review of Systems:    Pertinent positives include: syncope  Constitutional:  No weight loss, night sweats, Fevers, chills, fatigue, weight loss  HEENT:  No headaches, Difficulty swallowing,Tooth/dental problems,Sore throat,  No sneezing, itching, ear ache, nasal congestion, post nasal drip,  Cardio-vascular:  No chest pain, Orthopnea, PND, anasarca, dizziness, palpitations.no Bilateral lower extremity swelling  GI:  No heartburn, indigestion, abdominal pain, nausea, vomiting, diarrhea, change in bowel habits, loss of appetite, melena, blood in stool, hematemesis Resp:  no shortness of breath at rest. No dyspnea on exertion, No excess mucus, no productive cough, No non-productive cough, No coughing up of blood.No change in color of  mucus.No wheezing. Skin:  no rash or lesions. No jaundice GU:  no dysuria, change in color of urine, no urgency or frequency. No straining to urinate.  No flank pain.  Musculoskeletal:  No joint pain or no joint swelling. No decreased range of motion. No back pain.  Psych:  No change in mood or affect. No depression or anxiety. No memory loss.  Neuro: no localizing neurological complaints, no tingling, no weakness, no double vision, no gait abnormality, no slurred speech, no confusion  All systems reviewed and apart from HOPI all are negative _______________________________________________________________________________________________ Past Medical History:   Past Medical History:  Diagnosis Date   CHF (congestive heart failure) (HCC)    Depression    Dysrhythmia    Hypertension      History reviewed. No pertinent surgical history.  Social History:  Ambulatory  independently       reports that she does not drink alcohol and does not use drugs. No history on file for tobacco use.   Family History:   Family History  Family history unknown: Yes   ______________________________________________________________________________________________ Allergies: Allergies  Allergen Reactions   Alendronate Other (See Comments)    Hand Pain Other reaction(s): Other Hand pain  Hand pain  Hand Pain Other reaction(s): Other Hand pain     Dilaudid [Hydromorphone] Rash   Monosodium  Glutamate Anaphylaxis   Alendronate Sodium    Levofloxacin Hives   Lisinopril Cough   Morphine And Related Hives   Pravastatin Itching   Solifenacin Other (See Comments)    unknown   Tradjenta [Linagliptin]     unknown   Trospium     unknown   Augmentin [Amoxicillin-Pot Clavulanate]     Yeast infection   Sulfa Antibiotics Rash     Prior to Admission medications   Medication Sig Start Date End Date Taking? Authorizing Provider  apixaban (ELIQUIS) 5 MG TABS tablet Take 5 mg by mouth daily.  02/20/20   [provider]  aspirin 81 MG chewable tablet Take 81 mg by mouth daily.    [provider]  bisacodyl (DULCOLAX) 5 MG EC tablet Take 5 mg by mouth daily as needed.    [provider]  bumetanide (BUMEX) 2 MG tablet bumetanide 2 mg tablet   2 mg by oral route. 02/20/20   [provider]  carvedilol (COREG) 6.25 MG tablet Take 6.25 mg by mouth daily. 02/20/20 02/20/21  [provider]  Cholecalciferol 50 MCG (2000 UT) CAPS Take 4,000 Units by mouth daily.    [provider]  diclofenac Sodium (VOLTAREN) 1 % GEL Apply 2 g topically 4 (four) times daily.    [provider]  docusate sodium (COLACE) 100 MG capsule Take 300 mg by mouth at bedtime.    [provider]  DULoxetine (CYMBALTA) 60 MG capsule Take 60 mg by mouth daily. 07/01/18   [provider]  losartan (COZAAR) 50 MG tablet Take 50 mg by mouth daily. 08/26/20   [provider]  Multiple Vitamin (MULTI-VITAMIN) tablet Take 1 tablet by mouth daily.    [provider]  oxyCODONE-acetaminophen (PERCOCET) 10-325 MG tablet Take 1 tablet by mouth 3 (three) times daily as needed. 10/09/20   [provider]    ____________________________________________________________________________________________ Physical Exam: Vitals with BMI 01/25/2021 01/25/2021 01/25/2021  Height - 4\' 6"  -  Weight - 188 lbs -  BMI - 45.3 -  Systolic 155 - 158  Diastolic 75 - 78  Pulse 81 - 78     1. General:  in No  Acute distress    Chronically ill  -appearing 2. Psychological: Alert and  Oriented 3. Head/ENT:  Dry Mucous Membranes                          Head Non traumatic, neck supple                     Poor Dentition 4. SKIN: normal   Skin turgor,  Skin clean Dry and intact no rash 5. Heart: Regular rate and rhythm systolic Murmur, no Rub or gallop 6. Lungs:  no wheezes or crackles   7. Abdomen: Soft,  non-tender, Non distended  obese bowel  sounds present 8. Lower extremities: no clubbing, cyanosis, trace edema 9. Neurologically Grossly intact, moving all 4 extremities equally    10. MSK: Normal range of motion    Chart has been reviewed  ______________________________________________________________________________________________  Assessment/Plan  85 y.o. female with medical history significant of   HTN, p A.fib on Eliquis, diastolic CHF, obesity, aortic stenosis Admitted for syncope  Present on Admission:  Syncope and collapse - likely heat related -  given risk factor will admit obtain CE, monitor on tele and obtain carotid dopplers Given hx of Aortic stenosis have send a consult msg to cardiology  Obesity, Class III, BMI 40-49.9 (morbid obesity) (HCC) -    Essential hypertension - allow permissive HTN for tonight   AF (paroxysmal atrial fibrillation) (HCC) - continue eliquis   Adjustment disorder with mixed disturbance of emotions and conduct-  Acute on chronic diastolic CHF - gently diurese, given recent heat exposure would avoid over agressive dieresis  Homelessness -  trans care consult  Aortic stenosis - obtain echo   Other plan as per orders.  DVT prophylaxis:  Eliquis     Code Status:    Code Status: Prior  DNR/DNI   as per patient   I had personally discussed CODE STATUS with patient       Family Communication:   Family not at  Bedside    Disposition Plan:     To home once workup is complete and patient is stable   Following barriers for discharge:                            Electrolytes corrected                               Anemia corrected                             Pain controlled with PO medications                               Afebrile, white count improving able to transition to PO antibiotics                              Will need to be able to tolerate PO                            Will likely need home health, home O2, set up                           Will need consultants  to evaluate patient prior to discharge                        Would benefit from PT/OT eval prior to DC  Ordered                                       Transition of care consulted                      Consults called: emailed cardiology   Admission status:  ED Disposition     ED Disposition  Admit   Condition  --   Comment  Hospital Area: [facility] [[id number]]  Level of Care: Med-Surg [16]  Covid Evaluation: Asymptomatic Screening Protocol (No Symptoms)  Diagnosis: Syncope and collapse [780.2.ICD-9-CM]  Admitting Physician: Therisa Doyne [[id number]]  Attending Physician: Therisa Doyne [[id number]]           Obs      Level of care    tele   indefinitely please discontinue once patient no longer qualifies COVID-19 Labs    Lab Results  Component Value Date   SARSCOV2NAA NEGATIVE 11/02/2020     Precautions: admitted as  asymptomatic screening protocol     PPE: Used by the provider:   N95  eye Goggles,  Gloves     Tambra Muller 01/25/2021, 11:18 PM     [facility]     after 2 AM please page floor coverage PA If 7AM-7PM, please contact the day team taking care of the patient using [email or web address]   Patient was evaluated in the context of the global COVID-19 pandemic, which necessitated consideration that the patient might be at risk for infection with the SARS-CoV-2 virus that causes COVID-19. Institutional protocols and algorithms that pertain to the evaluation of patients at risk for COVID-19 are in a state of rapid change based on information released by regulatory bodies including the CDC and federal and state organizations. These policies and algorithms were followed during the patient's care.

## 2021-01-25 NOTE — ED Notes (Signed)
Pt c/o "raw" feeling with the purewick, requesting a new one; new one placed and lights cut down for patient comfort

## 2021-01-25 NOTE — ED Notes (Signed)
RRT notified of venous gas sent to lab by Inetta Fermo, RN.

## 2021-01-25 NOTE — ED Provider Notes (Signed)
[facility] Emergency Department Provider Note   ____________________________________________   Event Date/Time   First MD Initiated Contact with Patient 01/25/21 1945     (approximate)  I have reviewed the triage vital signs and the nursing notes.   HISTORY  Chief Complaint Heat Exposure and Near Syncope    HPI Alexandria Goodman is a 85 y.o. female with past medical history of hypertension paroxysmal atrial fibrillation on Eliquis, diastolic CHF who presents to the ED complaining of syncope.  Patient reports that she got hot while eating lunch, attempted to return to her car and turned on the [facility], but felt increasingly hot and eventually passed out.  She denies feeling lightheaded and was not sure what happened until she woke up again in her car.  She denies hitting her head, did not have any chest pain or shortness of breath with a syncopal episode.  She states that after waking up she went into the [employer] and laid down on the bench, after which bystanders called EMS.  Patient does state that she is currently homeless and has been living out of her car, states she previously lived with her daughter but had been kicked out.        Past Medical History:  Diagnosis Date   CHF (congestive heart failure) (HCC)    Depression    Dysrhythmia    Hypertension     Patient Active Problem List   Diagnosis Date Noted   MDD (major depressive disorder), single episode, moderate (HCC) 11/02/2020   Atypical chest pain 11/02/2020   AF (paroxysmal atrial fibrillation) (HCC) 11/02/2020   Essential hypertension 11/02/2020   Obesity, Class III, BMI 40-49.9 (morbid obesity) (HCC) 11/02/2020   Adjustment disorder with mixed disturbance of emotions and conduct 11/02/2020   Aortic stenosis 11/02/2020    History reviewed. No pertinent surgical history.  Prior to Admission medications   Medication Sig Start Date End Date Taking? Authorizing Provider  apixaban (ELIQUIS)  5 MG TABS tablet Take 5 mg by mouth daily. 02/20/20   [provider]  aspirin 81 MG chewable tablet Take 81 mg by mouth daily.    [provider]  bisacodyl (DULCOLAX) 5 MG EC tablet Take 5 mg by mouth daily as needed.    [provider]  bumetanide (BUMEX) 2 MG tablet bumetanide 2 mg tablet   2 mg by oral route. 02/20/20   [provider]  carvedilol (COREG) 6.25 MG tablet Take 6.25 mg by mouth daily. 02/20/20 02/20/21  [provider]  Cholecalciferol 50 MCG (2000 UT) CAPS Take 4,000 Units by mouth daily.    [provider]  diclofenac Sodium (VOLTAREN) 1 % GEL Apply 2 g topically 4 (four) times daily.    [provider]  docusate sodium (COLACE) 100 MG capsule Take 300 mg by mouth at bedtime.    [provider]  DULoxetine (CYMBALTA) 60 MG capsule Take 60 mg by mouth daily. 07/01/18   [provider]  losartan (COZAAR) 50 MG tablet Take 50 mg by mouth daily. 08/26/20   [provider]  Multiple Vitamin (MULTI-VITAMIN) tablet Take 1 tablet by mouth daily.    [provider]  oxyCODONE-acetaminophen (PERCOCET) 10-325 MG tablet Take 1 tablet by mouth 3 (three) times daily as needed. 10/09/20   [provider]    Allergies Alendronate, Dilaudid [hydromorphone], Monosodium glutamate, Alendronate sodium, Levofloxacin, Lisinopril, Morphine and related, Pravastatin, Solifenacin, Tradjenta [linagliptin], Trospium, Augmentin [amoxicillin-pot clavulanate], and Sulfa antibiotics  Family History  Family history unknown: Yes    Social History Social History   Tobacco Use   Smoking status: Never  Substance Use Topics   Alcohol use: Never   Drug use: Never    Review of Systems  Constitutional: No fever/chills Eyes: No visual changes. ENT: No sore throat. Cardiovascular: Denies chest pain.  Positive for syncope. Respiratory: Denies shortness of breath. Gastrointestinal: No abdominal pain.  No  nausea, no vomiting.  No diarrhea.  No constipation. Genitourinary: Negative for dysuria. Musculoskeletal: Negative for back pain. Skin: Negative for rash. Neurological: Negative for headaches, focal weakness or numbness.  ____________________________________________   PHYSICAL EXAM:  VITAL SIGNS: ED Triage Vitals  Enc Vitals Group     BP 01/25/21 1724 (!) 158/78     Pulse Rate 01/25/21 1724 78     Resp 01/25/21 1724 18     Temp 01/25/21 1725 98.2 F (36.8 C)     Temp Source 01/25/21 1725 Oral     SpO2 01/25/21 1724 97 %     Weight 01/25/21 1725 188 lb (85.3 kg)     Height 01/25/21 1725 4\' 6"  (1.372 m)     Head Circumference --      Peak Flow --      Pain Score 01/25/21 1725 0     Pain Loc --      Pain Edu? --      Excl. in GC? --     Constitutional: Alert and oriented. Eyes: Conjunctivae are normal. Head: Atraumatic. Nose: No congestion/rhinnorhea. Mouth/Throat: Mucous membranes are moist. Neck: Normal ROM Cardiovascular: Normal rate, regular rhythm.  Systolic murmur noted.  2+ radial pulses bilaterally. Respiratory: Normal respiratory effort.  No retractions. Lungs CTAB. Gastrointestinal: Soft and nontender. No distention. Genitourinary: deferred Musculoskeletal: No lower extremity tenderness nor edema. Neurologic:  Normal speech and language. No gross focal neurologic deficits are appreciated. Skin:  Skin is warm, dry and intact. No rash noted. Psychiatric: Mood and affect are normal. Speech and behavior are normal.  ____________________________________________   LABS (all labs ordered are listed, but only abnormal results are displayed)  Labs Reviewed  BASIC METABOLIC PANEL - Abnormal; Notable for the following components:      Result Value   CO2 34 (*)    Glucose, Bld 169 (*)    Calcium 8.7 (*)    All other components within normal limits  CBC - Abnormal; Notable for the following components:   RBC 3.71 (*)    HCT 35.9 (*)    Platelets 123 (*)    All  other components within normal limits  TROPONIN I (HIGH SENSITIVITY) - Abnormal; Notable for the following components:   Troponin I (High Sensitivity) 25 (*)    All other components within normal limits  SARS CORONAVIRUS 2 (TAT 6-24 HRS)  TROPONIN I (HIGH SENSITIVITY)   ____________________________________________  EKG  ED ECG REPORT I, 01/27/21, the attending physician, personally viewed and interpreted this ECG.   Date: 01/25/2021  EKG Time: 17:30  Rate: 82  Rhythm: normal sinus rhythm  Axis: LAD  Intervals:none  ST&T Change: None   PROCEDURES  Procedure(s) performed (including Critical Care):  Procedures   ____________________________________________   INITIAL IMPRESSION / ASSESSMENT AND PLAN / ED COURSE      85 year old female with past medical history of hypertension, diastolic CHF, and atrial fibrillation on Eliquis who presents to the ED following syncopal episode in her car after feeling overheated.  EKG shows no evidence of arrhythmia or ischemia and labs are  unremarkable, troponin mildly elevated but similar to previous.  Patient syncopal episode may have been related to heat exposure, however she is high risk for cardiac syncope given her age, history, and lack of prodromal symptoms.  Plan to discuss with hospitalist for admission.      ____________________________________________   FINAL CLINICAL IMPRESSION(S) / ED DIAGNOSES  Final diagnoses:  Syncope, unspecified syncope type  Heat exposure, initial encounter  Homelessness     ED Discharge Orders     None        Note:  This document was prepared using Dragon voice recognition software and may include unintentional dictation errors.    Chesley Noon, MD 01/25/21 2046

## 2021-01-26 ENCOUNTER — Encounter: Payer: Self-pay | Admitting: Internal Medicine

## 2021-01-26 ENCOUNTER — Observation Stay: Admit: 2021-01-26 | Payer: Medicare HMO

## 2021-01-26 DIAGNOSIS — Z5900 Homelessness unspecified: Secondary | ICD-10-CM | POA: Diagnosis not present

## 2021-01-26 DIAGNOSIS — R55 Syncope and collapse: Secondary | ICD-10-CM | POA: Diagnosis not present

## 2021-01-26 DIAGNOSIS — I350 Nonrheumatic aortic (valve) stenosis: Secondary | ICD-10-CM | POA: Diagnosis not present

## 2021-01-26 LAB — CBC WITH DIFFERENTIAL/PLATELET
Abs Immature Granulocytes: 0.02 10*3/uL (ref 0.00–0.07)
Basophils Absolute: 0 10*3/uL (ref 0.0–0.1)
Basophils Relative: 1 %
Eosinophils Absolute: 0.2 10*3/uL (ref 0.0–0.5)
Eosinophils Relative: 3 %
HCT: 34.1 % — ABNORMAL LOW (ref 36.0–46.0)
Hemoglobin: 11.4 g/dL — ABNORMAL LOW (ref 12.0–15.0)
Immature Granulocytes: 0 %
Lymphocytes Relative: 19 %
Lymphs Abs: 1.1 10*3/uL (ref 0.7–4.0)
MCH: 31.7 pg (ref 26.0–34.0)
MCHC: 33.4 g/dL (ref 30.0–36.0)
MCV: 94.7 fL (ref 80.0–100.0)
Monocytes Absolute: 0.8 10*3/uL (ref 0.1–1.0)
Monocytes Relative: 15 %
Neutro Abs: 3.6 10*3/uL (ref 1.7–7.7)
Neutrophils Relative %: 62 %
Platelets: 115 10*3/uL — ABNORMAL LOW (ref 150–400)
RBC: 3.6 MIL/uL — ABNORMAL LOW (ref 3.87–5.11)
RDW: 13.2 % (ref 11.5–15.5)
WBC: 5.7 10*3/uL (ref 4.0–10.5)
nRBC: 0 % (ref 0.0–0.2)

## 2021-01-26 LAB — COMPREHENSIVE METABOLIC PANEL
ALT: 31 U/L (ref 0–44)
AST: 44 U/L — ABNORMAL HIGH (ref 15–41)
Albumin: 2.8 g/dL — ABNORMAL LOW (ref 3.5–5.0)
Alkaline Phosphatase: 151 U/L — ABNORMAL HIGH (ref 38–126)
Anion gap: 9 (ref 5–15)
BUN: 12 mg/dL (ref 8–23)
CO2: 33 mmol/L — ABNORMAL HIGH (ref 22–32)
Calcium: 8.3 mg/dL — ABNORMAL LOW (ref 8.9–10.3)
Chloride: 101 mmol/L (ref 98–111)
Creatinine, Ser: 0.59 mg/dL (ref 0.44–1.00)
GFR, Estimated: >60 mL/min (ref >60)
Glucose, Bld: 122 mg/dL — ABNORMAL HIGH (ref 70–99)
Potassium: 3.4 mmol/L — ABNORMAL LOW (ref 3.5–5.1)
Sodium: 143 mmol/L (ref 135–145)
Total Bilirubin: 1.1 mg/dL (ref 0.3–1.2)
Total Protein: 5.9 g/dL — ABNORMAL LOW (ref 6.5–8.1)

## 2021-01-26 LAB — TSH: TSH: 0.784 u[IU]/mL (ref 0.350–4.500)

## 2021-01-26 LAB — LIPID PANEL
Cholesterol: 165 mg/dL (ref 0–200)
HDL: 45 mg/dL (ref >40)
LDL Cholesterol: 104 mg/dL — ABNORMAL HIGH (ref 0–99)
Total CHOL/HDL Ratio: 3.7 RATIO
Triglycerides: 81 mg/dL (ref <150)
VLDL: 16 mg/dL (ref 0–40)

## 2021-01-26 LAB — HEMOGLOBIN A1C
Hgb A1c MFr Bld: 6.3 % — ABNORMAL HIGH (ref 4.8–5.6)
Mean Plasma Glucose: 134 mg/dL

## 2021-01-26 LAB — SARS CORONAVIRUS 2 (TAT 6-24 HRS): SARS Coronavirus 2: NEGATIVE

## 2021-01-26 LAB — PHOSPHORUS: Phosphorus: 3.5 mg/dL (ref 2.5–4.6)

## 2021-01-26 LAB — MAGNESIUM: Magnesium: 1.8 mg/dL (ref 1.7–2.4)

## 2021-01-26 LAB — TROPONIN I (HIGH SENSITIVITY): Troponin I (High Sensitivity): 21 ng/L — ABNORMAL HIGH (ref <18)

## 2021-01-26 MED ORDER — POTASSIUM CHLORIDE 20 MEQ PO PACK
40.0000 meq | PACK | Freq: Once | ORAL | Status: AC
  Administered 2021-01-26: 11:00:00 40 meq via ORAL
  Filled 2021-01-26: qty 2

## 2021-01-26 NOTE — Evaluation (Signed)
Physical Therapy Evaluation Patient Details Name: Alexandria Goodman MRN: [id number] DOB: 06/27/1936 Today's Date: 01/26/2021   History of Present Illness  Pt is an 85 y.o. female with medical history significant of HTN, A.fib, diastolic CHF, obesity, and aortic stenosis.  MD assessment includes: Syncope and collapse, homelessness, and hypokalemia.   Clinical Impression  Pt was pleasant and motivated to participate during the session and put forth good effort throughout.  Pt required min A with bed mobility tasks but only close CGA with transfers and gait but only with significant effort and close CGA for safety.  Pt was able  to amb 15' with very slow cadence and short B step length with SpO2 and HR WNL on room air.  Pt will benefit from PT services in a SNF setting upon discharge to safely address deficits listed in patient problem list for decreased caregiver assistance and eventual return to PLOF.      Follow Up Recommendations SNF    Equipment Recommendations  3in1 (PT)    Recommendations for Other Services       Precautions / Restrictions Precautions Precautions: Fall Restrictions Weight Bearing Restrictions: No      Mobility  Bed Mobility Overal bed mobility: Needs Assistance Bed Mobility: Supine to Sit;Sit to Supine     Supine to sit: Min assist Sit to supine: Min assist   General bed mobility comments: Min A for LE and trunk control with HOB elevated and use of bed rails    Transfers Overall transfer level: Needs assistance Equipment used: Rolling walker (2 wheeled) Transfers: Sit to/from Stand Sit to Stand: Min guard;From elevated surface         General transfer comment: Multiple rocking attempts to come to standing from an elevated surface with cues for sequencing  Ambulation/Gait Ambulation/Gait assistance: Min guard Gait Distance (Feet): 15 Feet Assistive device: Rolling walker (2 wheeled) Gait Pattern/deviations: Step-through pattern;Decreased step  length - left;Decreased step length - right Gait velocity: decreased   General Gait Details: Slow cadence with short B step length but steady without LOB; min verbal cues for amb closer to the RW with upright posture  Stairs            Wheelchair Mobility    Modified Rankin (Stroke Patients Only)       Balance Overall balance assessment: Needs assistance Sitting-balance support: Feet supported Sitting balance-Leahy Scale: Good     Standing balance support: Bilateral upper extremity supported;During functional activity Standing balance-Leahy Scale: Fair Standing balance comment: Min lean on the RW for support                             Pertinent Vitals/Pain Pain Assessment: No/denies pain    Home Living Family/patient expects to be discharged to:: Unsure                 Additional Comments: Pt has been living in car since April; pt owns a rollator and [facility]    Prior Function Level of Independence: Independent with assistive device(s)         Comments: Mod Ind limited community distances mostly with a rollator with occasional SPC use; uses public restrooms for bathing and dressing needs, 3 falls in the last 6 months.     Hand Dominance   Dominant Hand: Right    Extremity/Trunk Assessment   Upper Extremity Assessment Upper Extremity Assessment: Generalized weakness    Lower Extremity Assessment Lower Extremity Assessment: Generalized weakness  Communication   Communication: HOH  Cognition Arousal/Alertness: Awake/alert Behavior During Therapy: WFL for tasks assessed/performed Overall Cognitive Status: Within Functional Limits for tasks assessed                                        General Comments      Exercises Total Joint Exercises Ankle Circles/Pumps: AROM;Strengthening;Both;10 reps Quad Sets: Strengthening;Both;5 reps;10 reps Gluteal Sets: Strengthening;Both;10 reps Towel Squeeze:  Strengthening;Both;10 reps Heel Slides: Strengthening;Both;10 reps Hip ABduction/ADduction: Strengthening;Both;10 reps;AAROM;AROM Long Arc Quad: Strengthening;Both;10 reps Knee Flexion: Strengthening;Both;10 reps Marching in Standing: AROM;Strengthening;Both;5 reps;Standing Other Exercises Other Exercises: HEP education for BLE APs, QS, and GS x 10 each every 1-2 hours daily   Assessment/Plan    PT Assessment Patient needs continued PT services  PT Problem List Decreased strength;Decreased activity tolerance;Decreased balance;Decreased mobility;Decreased knowledge of use of DME       PT Treatment Interventions DME instruction;Stair training;Gait training;Functional mobility training;Therapeutic activities;Therapeutic exercise;Balance training;Patient/family education    PT Goals (Current goals can be found in the Care Plan section)  Acute Rehab PT Goals Patient Stated Goal: To feel better and have a home PT Goal Formulation: With patient Time For Goal Achievement: 02/08/21 Potential to Achieve Goals: Good    Frequency Min 2X/week   Barriers to discharge Decreased caregiver support      Co-evaluation               AM-PAC PT "6 Clicks" Mobility  Outcome Measure Help needed turning from your back to your side while in a flat bed without using bedrails?: A Little Help needed moving from lying on your back to sitting on the side of a flat bed without using bedrails?: A Little Help needed moving to and from a bed to a chair (including a wheelchair)?: A Little Help needed standing up from a chair using your arms (e.g., wheelchair or bedside chair)?: A Little Help needed to walk in hospital room?: A Little Help needed climbing 3-5 steps with a railing? : A Lot 6 Click Score: 17    End of Session Equipment Utilized During Treatment: Gait belt Activity Tolerance: Patient tolerated treatment well Patient left: in bed;with call bell/phone within reach;with bed alarm set Nurse  Communication: Mobility status PT Visit Diagnosis: History of falling (Z91.81);Muscle weakness (generalized) (M62.81);Difficulty in walking, not elsewhere classified (R26.2)    Time: 3716-9678 PT Time Calculation (min) (ACUTE ONLY): 34 min   Charges:   PT Evaluation $PT Eval Moderate Complexity: 1 Mod PT Treatments $Therapeutic Exercise: 8-22 mins        D. Elly Modena PT, DPT 01/26/21, 5:12 PM

## 2021-01-26 NOTE — ED Notes (Signed)
Pt resting in bed with NAD, rise and fall of chest noted, eyes closed, bed locked and low, call light in reach.

## 2021-01-26 NOTE — Consult Note (Signed)
Cardiology Consultation Note    Patient ID: Alexandria Goodman, MRN: [id number], DOB/AGE: 10/22/1935 85 y.o. Admit date: 01/25/2021   Date of Consult: 01/26/2021 Primary Physician: Pcp, No Primary Cardiologist: Dr. Omelia Blackwater, [facility]  Chief Complaint: near syncope Reason for Consultation: as Requesting MD: Dr. Chipper Herb  HPI: Alexandria Goodman is a 85 y.o. female with history of history of chronic diastolic CHF, PAF, and moderate AS presented to to the emergency room after an episode of near syncope.  Has history as mentioned previously at least moderate aortic stenosis with preserved LV function.  TTE was performed showing severe aortic stenosis during a recent admission at [facility].   However, patient declined evaluation by structural heart team. Social work consult was placed as patient was homeless and living in her car and required assistance to receive access to medications and care.  She is currently reportedly on apixaban, aspirin, losartan 50 mg daily.  She is a difficult historian and is not giving very clear history today.  She appears to have ruled out for myocardial infarction with low flat high-sensitivity troponin in the low 20s.  Renal function appears normal.  EKG reveals sinus rhythm with probable LVH.  Chest x-ray reveals probable mild pulmonary edema.  Carotid Dopplers were unremarkable.  She is hemodynamically stable.  There have been no dysrhythmias.  Past Medical History:  Diagnosis Date   CHF (congestive heart failure) (HCC)    Depression    Dysrhythmia    Hypertension       Surgical History: History reviewed. No pertinent surgical history.   Home Meds: Prior to Admission medications   Medication Sig Start Date End Date Taking? Authorizing Provider  apixaban (ELIQUIS) 5 MG TABS tablet Take 5 mg by mouth daily. 02/20/20   [provider]  aspirin 81 MG chewable tablet Take 81 mg by mouth daily.    [provider]  bisacodyl (DULCOLAX) 5 MG EC tablet Take 5 mg by  mouth daily as needed.    [provider]  bumetanide (BUMEX) 2 MG tablet bumetanide 2 mg tablet   2 mg by oral route. 02/20/20   [provider]  carvedilol (COREG) 6.25 MG tablet Take 6.25 mg by mouth daily. 02/20/20 02/20/21  [provider]  Cholecalciferol 50 MCG (2000 UT) CAPS Take 4,000 Units by mouth daily.    [provider]  diclofenac Sodium (VOLTAREN) 1 % GEL Apply 2 g topically 4 (four) times daily.    [provider]  docusate sodium (COLACE) 100 MG capsule Take 300 mg by mouth at bedtime.    [provider]  DULoxetine (CYMBALTA) 60 MG capsule Take 60 mg by mouth daily. 07/01/18   [provider]  losartan (COZAAR) 50 MG tablet Take 50 mg by mouth daily. 08/26/20   [provider]  Multiple Vitamin (MULTI-VITAMIN) tablet Take 1 tablet by mouth daily.    [provider]  oxyCODONE-acetaminophen (PERCOCET) 10-325 MG tablet Take 1 tablet by mouth 3 (three) times daily as needed. 10/09/20   [provider]    Inpatient Medications:   apixaban  5 mg Oral Daily   aspirin  81 mg Oral Daily   carvedilol  6.25 mg Oral Daily   furosemide  40 mg Intravenous Daily   potassium chloride  40 mEq Oral Once     Allergies:  Allergies  Allergen Reactions   Alendronate Other (See Comments)    Hand Pain Other reaction(s): Other Hand pain  Hand pain  Hand Pain  Other reaction(s): Other Hand pain     Dilaudid [Hydromorphone] Rash   Monosodium Glutamate Anaphylaxis   Alendronate Sodium    Levofloxacin Hives   Lisinopril Cough   Morphine And Related Hives   Pravastatin Itching   Solifenacin Other (See Comments)    unknown   Tradjenta [Linagliptin]     unknown   Trospium     unknown   Augmentin [Amoxicillin-Pot Clavulanate]     Yeast infection   Sulfa Antibiotics Rash    Social History   Socioeconomic History   Marital status: Widowed    Spouse name: Not on file   Number of children: Not on  file   Years of education: Not on file   Highest education level: Not on file  Occupational History   Not on file  Tobacco Use   Smoking status: Never   Smokeless tobacco: Not on file  Substance and Sexual Activity   Alcohol use: Never   Drug use: Never   Sexual activity: Not on file  Other Topics Concern   Not on file  Social History Narrative   Not on file   Social Determinants of Health   Financial Resource Strain: Not on file  Food Insecurity: Not on file  Transportation Needs: Not on file  Physical Activity: Not on file  Stress: Not on file  Social Connections: Not on file  Intimate Partner Violence: Not on file     Family History  Family history unknown: Yes     Review of Systems: A 12-system review of systems was performed and is negative except as noted in the HPI.  Labs: Recent Labs    01/25/21 1728  CKTOTAL 51   Lab Results  Component Value Date   WBC 5.7 01/26/2021   HGB 11.4 (L) 01/26/2021   HCT 34.1 (L) 01/26/2021   MCV 94.7 01/26/2021   PLT 115 (L) 01/26/2021    Recent Labs  Lab 01/26/21 0503  NA 143  K 3.4*  CL 101  CO2 33*  BUN 12  CREATININE 0.59  CALCIUM 8.3*  PROT 5.9*  BILITOT 1.1  ALKPHOS 151*  ALT 31  AST 44*  GLUCOSE 122*   Lab Results  Component Value Date   CHOL 165 01/26/2021   HDL 45 01/26/2021   LDLCALC 104 (H) 01/26/2021   TRIG 81 01/26/2021   No results found for: DDIMER  Radiology/Studies:  US Carotid Bilateral  Result Date: 01/26/2021 CLINICAL DATA:  Syncope EXAM: BILATERAL CAROTID DUPLEX ULTRASOUND TECHNIQUE: Wallace Cullens scale imaging, color Doppler and duplex ultrasound were performed of bilateral carotid and vertebral arteries in the neck. COMPARISON:  None. FINDINGS: Criteria: Quantification of carotid stenosis is based on velocity parameters that correlate the residual internal carotid diameter with NASCET-based stenosis levels, using the diameter of the distal internal carotid lumen as the denominator for  stenosis measurement. The following velocity measurements were obtained: RIGHT ICA: 61/15 cm/sec CCA: 105/13 cm/sec SYSTOLIC ICA/CCA RATIO:  0.7 ECA: 60 cm/sec LEFT ICA: 50/12 cm/sec CCA: 60/19 cm/sec SYSTOLIC ICA/CCA RATIO:  0.8 ECA: 73 cm/sec RIGHT CAROTID ARTERY: No significant plaque formation or stenosis. RIGHT VERTEBRAL ARTERY:  Antegrade flow LEFT CAROTID ARTERY:  No significant plaque formation or stenosis. LEFT VERTEBRAL ARTERY:  Antegrade flow IMPRESSION: No significant plaque formation or stenosis bilaterally. Electronically Signed   By: Charlett Nose M.D.   On: 01/26/2021 00:19   DG Chest Port 1 View  Result Date: 01/25/2021 CLINICAL DATA:  Syncope episode EXAM: PORTABLE CHEST 1 VIEW COMPARISON:  11/02/2020 FINDINGS: Cardiomegaly with vascular congestion and mild diffuse interstitial process likely edema. No pleural effusion, pneumothorax, or focal airspace disease IMPRESSION: Cardiomegaly with vascular congestion and mild diffuse interstitial opacity, probably representing mild edema Electronically Signed   By: Jasmine Pang M.D.   On: 01/25/2021 21:49    Wt Readings from Last 3 Encounters:  01/25/21 85.3 kg  11/02/20 85.3 kg  10/18/20 85.3 kg    EKG: Sinus rhythm with nonspecific ST-T wave changes.  Physical Exam: No acute illness.  Difficult historian. Blood pressure 130/64, pulse 72, temperature 98.1 F (36.7 C), temperature source Oral, resp. rate 20, height 4\' 6"  (1.372 m), weight 85.3 kg, SpO2 98 %. Body mass index is 45.33 kg/m. General: Well developed, well nourished, in no acute distress. Head: Normocephalic, atraumatic, sclera non-icteric, no xanthomas, nares are without discharge.  Neck: Negative for carotid bruits. JVD not elevated. Lungs: Clear bilaterally to auscultation without wheezes, rales, or rhonchi. Breathing is unlabored. Heart: RRR with S1 S2. No murmurs, rubs, or gallops appreciated. Abdomen: Soft, non-tender, non-distended with normoactive bowel sounds. No  hepatomegaly. No rebound/guarding. No obvious abdominal masses. Msk:  Strength and tone appear normal for age. Extremities: No clubbing or cyanosis. No edema.  Distal pedal pulses are 2+ and equal bilaterally. Neuro: Alert and oriented X 3.  Difficult historian.     Assessment and Plan  85 year old female who unfortunately is homeless.  She has a storage unit and uses the facility bathroom or restaurant bathrooms for care.  She lives in a car since April 2022.  She has moderate severe aortic stenosis.  Is not a candidate for TAVR at present.  Etiology of event is unclear.  No evidence of ischemia or obvious dysrhythmia.  Would continue with current medical regimen.  Will need social service evaluation prior to discharge due to her disposition.  Patient had an echo less than a month ago.  We will continue with current regimen and follow-up with her care team and [facility].  Signed, May 2022 MD 01/26/2021, 10:21 AM Pager: [phone]

## 2021-01-26 NOTE — Care Management Obs Status (Signed)
MEDICARE OBSERVATION STATUS NOTIFICATION   Patient Details  Name: Alexandria Goodman MRN: [id number] Date of Birth: Apr 22, 1936   Medicare Observation Status Notification Given:  Yes    Margarito Liner, LCSW 01/26/2021, 4:55 PM

## 2021-01-26 NOTE — Evaluation (Signed)
Occupational Therapy Evaluation Patient Details Name: Alexandria Goodman MRN: [id number] DOB: 1935-10-12 Today's Date: 01/26/2021    History of Present Illness 85 y.o. female with medical history significant of   HTN, p A.fib on Eliquis, diastolic CHF, obesity, aortic stenosis   Clinical Impression   Patient presenting with decreased I in self care, balance, functional mobility/transfers, endurance, and safety awareness. Patient is currently homeless and has been living in her car since April 2022. Pt reports use of RW and SPC at times for mobility. She has a storage unit and is using the facility bathroom or restaurant bathrooms to wash up.  Patient currently functioning at min - mod A and very limited secondary to pain and fatigue this session.  Patient will benefit from acute OT to increase overall independence in the areas of ADLs, functional mobility, and safety awareness in order to safely discharge to next venue of care.    Follow Up Recommendations  SNF    Equipment Recommendations  Other (comment) (defer to next venue of care)       Precautions / Restrictions Precautions Precautions: Fall      Mobility Bed Mobility Overal bed mobility: Needs Assistance Bed Mobility: Supine to Sit;Sit to Supine     Supine to sit: Mod assist Sit to supine: Min assist   General bed mobility comments: from flat bed without bed rails- assistance for trunk and R LE    Transfers Overall transfer level: Needs assistance Equipment used: 1 person hand held assist Transfers: Sit to/from Stand Sit to Stand: Mod assist;Min assist         General transfer comment: STS x 2 reps from EOB. Pt fearful and reporting increased pain with task.    Balance Overall balance assessment: Needs assistance Sitting-balance support: Feet supported Sitting balance-Leahy Scale: Good     Standing balance support: During functional activity Standing balance-Leahy Scale: Fair Standing balance comment: UE  support for safety                           ADL either performed or assessed with clinical judgement   ADL Overall ADL's : Needs assistance/impaired     Grooming: Wash/dry hands;Wash/dry face;Brushing hair;Sitting;Set up               Lower Body Dressing: Minimal assistance;Sit to/from stand   Toilet Transfer: Moderate assistance Toilet Transfer Details (indicate cue type and reason): simulated           General ADL Comments: Pt limited secondary to R LE pain and fatigue     Vision Baseline Vision/History: Wears glasses Patient Visual Report: No change from baseline              Pertinent Vitals/Pain Pain Assessment: 0-10 Pain Score: 8  Pain Location: R LE Pain Descriptors / Indicators: Aching;Discomfort;Grimacing Pain Intervention(s): Limited activity within patient's tolerance;Monitored during session;Repositioned     Hand Dominance Right   Extremity/Trunk Assessment Upper Extremity Assessment Upper Extremity Assessment: Generalized weakness;Overall [facility] for tasks assessed   Lower Extremity Assessment Lower Extremity Assessment: Generalized weakness;Overall WFL for tasks assessed       Communication Communication Communication: HOH   Cognition Arousal/Alertness: Awake/alert Behavior During Therapy: WFL for tasks assessed/performed Overall Cognitive Status: Within Functional Limits for tasks assessed  Home Living Family/patient expects to be discharged to:: Shelter/Homeless                                 Additional Comments: Pt has been living in car since April      Prior Functioning/Environment Level of Independence: Independent with assistive device(s)        Comments: use of RW and cane for mobility tasks and using public restroom for bathing and dressing needs.        OT Problem List: Decreased strength;Decreased knowledge of use of DME or  AE;Decreased activity tolerance;Cardiopulmonary status limiting activity;Impaired balance (sitting and/or standing);Decreased safety awareness;Pain;Obesity      OT Treatment/Interventions: Self-care/ADL training;Manual therapy;Therapeutic exercise;Patient/family education;Neuromuscular education;Balance training;Energy conservation;Therapeutic activities;DME and/or AE instruction    OT Goals(Current goals can be found in the care plan section) Acute Rehab OT Goals Patient Stated Goal: to feel better and have a home OT Goal Formulation: With patient Time For Goal Achievement: 02/09/21 Potential to Achieve Goals: Fair ADL Goals Pt Will Perform Grooming: with modified independence;standing Pt Will Perform Lower Body Dressing: with modified independence;sit to/from stand Pt Will Transfer to Toilet: with modified independence;ambulating Pt Will Perform Toileting - Clothing Manipulation and hygiene: with modified independence;sit to/from stand  OT Frequency: Min 2X/week   Barriers to D/C:    Pt is homeless          AM-PAC OT "6 Clicks" Daily Activity     Outcome Measure Help from another person eating meals?: None Help from another person taking care of personal grooming?: None Help from another person toileting, which includes using toliet, bedpan, or urinal?: A Lot Help from another person bathing (including washing, rinsing, drying)?: A Lot Help from another person to put on and taking off regular upper body clothing?: A Little Help from another person to put on and taking off regular lower body clothing?: A Lot 6 Click Score: 17   End of Session Nurse Communication: Mobility status;Patient requests pain meds  Activity Tolerance: Patient limited by pain Patient left: in bed;with call bell/phone within reach  OT Visit Diagnosis: Unsteadiness on feet (R26.81);Repeated falls (R29.6);Muscle weakness (generalized) (M62.81)                Time: 1014-1040 OT Time Calculation (min): 26  min Charges:  OT General Charges $OT Visit: 1 Visit OT Evaluation $OT Eval Moderate Complexity: 1 Mod OT Treatments $Self Care/Home Management : 8-22 mins  Jackquline Denmark, MS, OTR/L , CBIS ascom [phone]  01/26/21, 10:51 AM

## 2021-01-26 NOTE — TOC Initial Note (Signed)
Transition of Care [facility]) - Initial/Assessment Note    Patient Details  Name: Alexandria Goodman MRN: [id number] Date of Birth: 02/07/1936  Transition of Care [facility]) CM/SW Contact:    Candie Chroman, LCSW Phone Number: 01/26/2021, 4:59 PM  Clinical Narrative:    CSW met with patient. No supports at bedside. CSW introduced role and inquired about resource needs. Patient has been living in her car the past 2-3 months since her daughter made her leave her home. She was living in a community called [facility] in [place], [place] prior to moving in with her daughter and has completed an application to move back in. The staff is looking for an apartment for her to move into but patient said this process usually takes 6 months and she only completed the application 1 month ago. Patient has income of a little less than $2000 per month. Her car is hopefully still at the [place] at [employer]. Encouraged her to call them to be sure. Her cell phone is with her in the room. She confirmed she has her car keys. Told her we would be able to arrange a ride back to her car. Patient does not want to go to a shelter if she can help it but gave her the resource list just in case. She has friends in [place]. CSW encouraged her to call them and see if she can stay with them temporarily. No further concerns. CSW encouraged patient to contact CSW as needed. CSW will continue to follow patient for support and facilitate discharge stable.              Expected Discharge Plan:  (Car/Shelter) Barriers to Discharge: Continued Medical Work up   Patient Goals and CMS Choice        Expected Discharge Plan and Services Expected Discharge Plan:  (Car/Shelter)     Post Acute Care Choice: NA Living arrangements for the past 2 months: Homeless                                      Prior Living Arrangements/Services Living arrangements for the past 2 months: Homeless Lives with:: Self Patient language and need for  interpreter reviewed:: Yes        Need for Family Participation in Patient Care: Yes (Comment) Care giver support system in place?: No (comment)   Criminal Activity/Legal Involvement Pertinent to Current Situation/Hospitalization: No - Comment as needed  Activities of Daily Living Home Assistive Devices/Equipment: Walker (specify type) ADL Screening (condition at time of admission) Patient's cognitive ability adequate to safely complete daily activities?: Yes Is the patient deaf or have difficulty hearing?: Yes Does the patient have difficulty seeing, even when wearing glasses/contacts?: No Does the patient have difficulty concentrating, remembering, or making decisions?: No Patient able to express need for assistance with ADLs?: Yes Does the patient have difficulty dressing or bathing?: No Independently performs ADLs?: Yes (appropriate for developmental age) Does the patient have difficulty walking or climbing stairs?: Yes Weakness of Legs: Both Weakness of Arms/Hands: None  Permission Sought/Granted                  Emotional Assessment Appearance:: Appears stated age Attitude/Demeanor/Rapport: Engaged, Gracious Affect (typically observed): Appropriate, Calm, Pleasant Orientation: : Oriented to Self, Oriented to Place, Oriented to  Time, Oriented to Situation Alcohol / Substance Use: Not Applicable Psych Involvement: No (comment)  Admission diagnosis:  Syncope and  collapse [R55] Syncope [R55] Homelessness [Z59.00] Heat exposure, initial encounter [T67.9XXA] Syncope, unspecified syncope type [R55] Patient Active Problem List   Diagnosis Date Noted   Syncope and collapse 01/25/2021   Homelessness 01/25/2021   Obesity (BMI 35.0-39.9 without comorbidity) 01/25/2021   MDD (major depressive disorder), single episode, moderate ([place]) 11/02/2020   Atypical chest pain 11/02/2020   AF (paroxysmal atrial fibrillation) ([place]) 11/02/2020   Essential hypertension 11/02/2020    Obesity, Class III, BMI 40-49.9 (morbid obesity) ([place]) 11/02/2020   Adjustment disorder with mixed disturbance of emotions and conduct 11/02/2020   Aortic stenosis 11/02/2020   PCP:  Pcp, No Pharmacy:   [id number] [place] [place]Phone: [phone]: [phone]    Social Determinants of Health (SDOH) Interventions    Readmission Risk Interventions No flowsheet data found.

## 2021-01-26 NOTE — Progress Notes (Signed)
PROGRESS NOTE    Alexandria Goodman  VEH:[id number] DOB: Jan 14, 1936 DOA: 01/25/2021 PCP: Pcp, No   Chief complaint.  Syncope. Brief Narrative:  Alexandria Goodman is a 85 y.o. female with medical history significant of   HTN, p A.fib on Eliquis, diastolic CHF, obesity, aortic stenosis who presented with  syncope.  Patient currently homeless, she lives in a old car.  It was very hot yesterday, she was diaphoretic, she blacked out. She also had a history of moderate aortic stenosis.   Assessment & Plan:   Active Problems:   AF (paroxysmal atrial fibrillation) (HCC)   Essential hypertension   Obesity, Class III, BMI 40-49.9 (morbid obesity) (HCC)   Adjustment disorder with mixed disturbance of emotions and conduct   Aortic stenosis   Syncope and collapse   Homelessness   Obesity (BMI 35.0-39.9 without comorbidity)  #1.  Syncope and collapse. Appreciate cardiology consult.  Patient does not have a clinical evidence of acute on chronic congestive heart failure, discontinue IV Lasix.  Her syncope was due to aortic stenosis in addition to dehydration in a very hot environment. No additional work-up is indicated.  2.  Homelessness. Spoke with Child psychotherapist, will keep her overnight to work out a solution for discharge.  Most likely she had to be discharged to her prior residence with the resource provided.  #3.  Paroxysmal atrial fibrillation Continue Eliquis  4.  Essential hypertension. Continue home medicines.  #5.  Hypokalemia. Repleted with orally.  Check level tomorrow.  DVT prophylaxis: Eliquis Code Status: DNR Family Communication:  Disposition Plan:    Status is: Observation    Dispo: The patient is from: Home              Anticipated d/c is to: Home              Patient currently is medically stable to d/c.   Difficult to place patient No        I/O last 3 completed shifts: In: 76.3 [I.V.:76.3] Out: 1100 [Urine:1100] No intake/output data recorded.      Consultants:  Cardiology  Procedures: None  Antimicrobials: None Subjective: Patient is very hard of hearing, otherwise she is doing well, he denies any dizziness, no additional syncope. She has good appetite without nausea vomiting. She does not have any short of breath or chest pain. No dysuria or hematuria. No fever or chills.  Objective: Vitals:   01/26/21 0800 01/26/21 0815 01/26/21 0922 01/26/21 1109  BP: (!) 122/57  130/64 (!) 141/56  Pulse: 70 72 72 68  Resp: 16 15 20 18   Temp:   98.1 F (36.7 C) 98.1 F (36.7 C)  TempSrc:   Oral Oral  SpO2: 92% 95% 98% 98%  Weight:      Height:        Intake/Output Summary (Last 24 hours) at 01/26/2021 1427 Last data filed at 01/26/2021 0650 Gross per 24 hour  Intake 76.28 ml  Output 1100 ml  Net -1023.72 ml   Filed Weights   01/25/21 1725  Weight: 85.3 kg    Examination:  General exam: Appears calm and comfortable  Respiratory system: Clear to auscultation. Respiratory effort normal. Cardiovascular system: S1 & S2 heard, RRR. No JVD, murmurs, rubs, gallops or clicks. No pedal edema. Gastrointestinal system: Abdomen is nondistended, soft and nontender. No organomegaly or masses felt. Normal bowel sounds heard. Central nervous system: Alert and oriented. No focal neurological deficits. Extremities: Symmetric 5 x 5 power. Skin: No rashes, lesions or ulcers Psychiatry:  Mood & affect appropriate.     Data Reviewed: I have personally reviewed following labs and imaging studies  CBC: Recent Labs  Lab 01/25/21 1728 01/26/21 0503  WBC 5.4 5.7  NEUTROABS  --  3.6  HGB 12.0 11.4*  HCT 35.9* 34.1*  MCV 96.8 94.7  PLT 123* 115*   Basic Metabolic Panel: Recent Labs  Lab 01/25/21 1728 01/26/21 0503  NA 140 143  K 3.7 3.4*  CL 101 101  CO2 34* 33*  GLUCOSE 169* 122*  BUN 15 12  CREATININE 0.72 0.59  CALCIUM 8.7* 8.3*  MG 1.8 1.8  PHOS 3.5 3.5   GFR: Estimated Creatinine Clearance: 44.7 mL/min (by C-G  formula based on SCr of 0.59 mg/dL). Liver Function Tests: Recent Labs  Lab 01/25/21 2016 01/26/21 0503  AST 62* 44*  ALT 37 31  ALKPHOS 176* 151*  BILITOT 1.1 1.1  PROT 6.1* 5.9*  ALBUMIN 3.2* 2.8*   No results for input(s): LIPASE, AMYLASE in the last 168 hours. No results for input(s): AMMONIA in the last 168 hours. Coagulation Profile: No results for input(s): INR, PROTIME in the last 168 hours. Cardiac Enzymes: Recent Labs  Lab 01/25/21 1728  CKTOTAL 51   BNP (last 3 results) No results for input(s): PROBNP in the last 8760 hours. HbA1C: No results for input(s): HGBA1C in the last 72 hours. CBG: No results for input(s): GLUCAP in the last 168 hours. Lipid Profile: Recent Labs    01/26/21 0503  CHOL 165  HDL 45  LDLCALC 104*  TRIG 81  CHOLHDL 3.7   Thyroid Function Tests: Recent Labs    01/26/21 0503  TSH 0.784   Anemia Panel: No results for input(s): VITAMINB12, FOLATE, FERRITIN, TIBC, IRON, RETICCTPCT in the last 72 hours. Sepsis Labs: No results for input(s): PROCALCITON, LATICACIDVEN in the last 168 hours.  Recent Results (from the past 240 hour(s))  SARS CORONAVIRUS 2 (TAT 6-24 HRS) Nasopharyngeal Nasopharyngeal Swab     Status: None   Collection Time: 01/25/21  8:55 PM   Specimen: Nasopharyngeal Swab  Result Value Ref Range Status   SARS Coronavirus 2 NEGATIVE NEGATIVE Final    Comment: (NOTE) SARS-CoV-2 target nucleic acids are NOT DETECTED.  The SARS-CoV-2 RNA is generally detectable in upper and lower respiratory specimens during the acute phase of infection. Negative results do not preclude SARS-CoV-2 infection, do not rule out co-infections with other pathogens, and should not be used as the sole basis for treatment or other patient management decisions. Negative results must be combined with clinical observations, patient history, and epidemiological information. The expected result is Negative.  Fact Sheet for  Patients: HairSlick.no  Fact Sheet for Healthcare Providers: quierodirigir.com  This test is not yet approved or cleared by the [place] FDA and  has been authorized for detection and/or diagnosis of SARS-CoV-2 by FDA under an Emergency Use Authorization (EUA). This EUA will remain  in effect (meaning this test can be used) for the duration of the COVID-19 declaration under Se ction 564(b)(1) of the Act, 21 U.S.C. section 360bbb-3(b)(1), unless the authorization is terminated or revoked sooner.  Performed at [facility], [address], [place], [place] [address]          Radiology Studies: US Carotid Bilateral  Result Date: 01/26/2021 CLINICAL DATA:  Syncope EXAM: BILATERAL CAROTID DUPLEX ULTRASOUND TECHNIQUE: Wallace Cullens scale imaging, color Doppler and duplex ultrasound were performed of bilateral carotid and vertebral arteries in the neck. COMPARISON:  None. FINDINGS: Criteria:  Quantification of carotid stenosis is based on velocity parameters that correlate the residual internal carotid diameter with NASCET-based stenosis levels, using the diameter of the distal internal carotid lumen as the denominator for stenosis measurement. The following velocity measurements were obtained: RIGHT ICA: 61/15 cm/sec CCA: 105/13 cm/sec SYSTOLIC ICA/CCA RATIO:  0.7 ECA: 60 cm/sec LEFT ICA: 50/12 cm/sec CCA: 60/19 cm/sec SYSTOLIC ICA/CCA RATIO:  0.8 ECA: 73 cm/sec RIGHT CAROTID ARTERY: No significant plaque formation or stenosis. RIGHT VERTEBRAL ARTERY:  Antegrade flow LEFT CAROTID ARTERY:  No significant plaque formation or stenosis. LEFT VERTEBRAL ARTERY:  Antegrade flow IMPRESSION: No significant plaque formation or stenosis bilaterally. Electronically Signed   By: Charlett Nose M.D.   On: 01/26/2021 00:19   DG Chest Port 1 View  Result Date: 01/25/2021 CLINICAL DATA:  Syncope episode EXAM: PORTABLE CHEST 1 VIEW COMPARISON:  11/02/2020  FINDINGS: Cardiomegaly with vascular congestion and mild diffuse interstitial process likely edema. No pleural effusion, pneumothorax, or focal airspace disease IMPRESSION: Cardiomegaly with vascular congestion and mild diffuse interstitial opacity, probably representing mild edema Electronically Signed   By: Jasmine Pang M.D.   On: 01/25/2021 21:49        Scheduled Meds:  apixaban  5 mg Oral Daily   aspirin  81 mg Oral Daily   carvedilol  6.25 mg Oral Daily   Continuous Infusions:   LOS: 0 days    Time spent: 32 minutes    Marrion Coy, MD [facility]   To contact the attending provider between 7A-7P or the covering provider during after hours 7P-7A, please log into the web site [email or web address] and access using universal [place] password for that web site. If you do not have the password, please call the hospital operator.  01/26/2021, 2:27 PM

## 2021-01-27 DIAGNOSIS — I350 Nonrheumatic aortic (valve) stenosis: Secondary | ICD-10-CM | POA: Diagnosis not present

## 2021-01-27 DIAGNOSIS — E876 Hypokalemia: Secondary | ICD-10-CM

## 2021-01-27 DIAGNOSIS — R55 Syncope and collapse: Secondary | ICD-10-CM | POA: Diagnosis not present

## 2021-01-27 LAB — MAGNESIUM: Magnesium: 1.9 mg/dL (ref 1.7–2.4)

## 2021-01-27 LAB — BASIC METABOLIC PANEL
Anion gap: 5 (ref 5–15)
BUN: 14 mg/dL (ref 8–23)
CO2: 35 mmol/L — ABNORMAL HIGH (ref 22–32)
Calcium: 8.9 mg/dL (ref 8.9–10.3)
Chloride: 101 mmol/L (ref 98–111)
Creatinine, Ser: 0.81 mg/dL (ref 0.44–1.00)
GFR, Estimated: >60 mL/min (ref >60)
Glucose, Bld: 119 mg/dL — ABNORMAL HIGH (ref 70–99)
Potassium: 4.3 mmol/L (ref 3.5–5.1)
Sodium: 141 mmol/L (ref 135–145)

## 2021-01-27 LAB — GLUCOSE, CAPILLARY: Glucose-Capillary: 123 mg/dL — ABNORMAL HIGH (ref 70–99)

## 2021-01-27 NOTE — Progress Notes (Signed)
Patient Name: Alexandria Goodman Date of Encounter: 01/27/2021  Hospital Problem List     Active Problems:   AF (paroxysmal atrial fibrillation) (HCC)   Essential hypertension   Obesity, Class III, BMI 40-49.9 (morbid obesity) (HCC)   Adjustment disorder with mixed disturbance of emotions and conduct   Aortic stenosis   Syncope and collapse   Homelessness   Obesity (BMI 35.0-39.9 without comorbidity)   Hypokalemia    Patient Profile     85 y.o. female with history of history of chronic diastolic CHF, PAF, and moderate AS presented to to the emergency room after an episode of near syncope.  Has history as mentioned previously at least moderate aortic stenosis with preserved LV function.  TTE was performed showing severe aortic stenosis during a recent admission at [facility].   However, patient declined evaluation by structural heart team. Social work consult was placed as patient was homeless and living in her car and required assistance to receive access to medications and care.  She is currently reportedly on apixaban, aspirin, losartan 50 mg daily.  She is a difficult historian and is not giving very clear history today.  She appears to have ruled out for myocardial infarction with low flat high-sensitivity troponin in the low 20s.  Renal function appears normal.  EKG reveals sinus rhythm with probable LVH.  Chest x-ray reveals probable mild pulmonary edema.  Carotid Dopplers were unremarkable.  She is hemodynamically stable.  There have been no dysrhythmias.  Subjective     Inpatient Medications     apixaban  5 mg Oral Daily   aspirin  81 mg Oral Daily   carvedilol  6.25 mg Oral Daily    Vital Signs    Vitals:   01/27/21 0345 01/27/21 0726 01/27/21 0844 01/27/21 1131  BP: (!) 141/65  136/67 (!) 145/56  Pulse: 66  68 61  Resp: 16 18 16 18   Temp: 97.7 F (36.5 C)  97.6 F (36.4 C) 98.7 F (37.1 C)  TempSrc: Oral  Oral Oral  SpO2: 94%  96% 96%  Weight:      Height:         Intake/Output Summary (Last 24 hours) at 01/27/2021 1655 Last data filed at 01/27/2021 1428 Gross per 24 hour  Intake 600 ml  Output 750 ml  Net -150 ml   Filed Weights   01/25/21 1725  Weight: 85.3 kg    Physical Exam    GEN: Well nourished, well developed, in no acute distress.  HEENT: normal.  Neck: Supple, no JVD, carotid bruits, or masses. Cardiac: RRR, no murmurs, rubs, or gallops. No clubbing, cyanosis, edema.  Radials/DP/PT 2+ and equal bilaterally.  Respiratory:  Respirations regular and unlabored, clear to auscultation bilaterally. GI: Soft, nontender, nondistended, BS + x 4. MS: no deformity or atrophy. Skin: warm and dry, no rash. Neuro:  Strength and sensation are intact. Psych: Normal affect.  Labs    CBC Recent Labs    01/25/21 1728 01/26/21 0503  WBC 5.4 5.7  NEUTROABS  --  3.6  HGB 12.0 11.4*  HCT 35.9* 34.1*  MCV 96.8 94.7  PLT 123* 115*   Basic Metabolic Panel Recent Labs    01/28/21 1728 01/26/21 0503 01/27/21 0534  NA 140 143 141  K 3.7 3.4* 4.3  CL 101 101 101  CO2 34* 33* 35*  GLUCOSE 169* 122* 119*  BUN 15 12 14   CREATININE 0.72 0.59 0.81  CALCIUM 8.7* 8.3* 8.9  MG 1.8 1.8 1.9  PHOS 3.5 3.5  --    Liver Function Tests Recent Labs    01/25/21 2016 01/26/21 0503  AST 62* 44*  ALT 37 31  ALKPHOS 176* 151*  BILITOT 1.1 1.1  PROT 6.1* 5.9*  ALBUMIN 3.2* 2.8*   No results for input(s): LIPASE, AMYLASE in the last 72 hours. Cardiac Enzymes Recent Labs    01/25/21 1728  CKTOTAL 51   BNP Recent Labs    01/25/21 1728  BNP 923.0*   D-Dimer No results for input(s): DDIMER in the last 72 hours. Hemoglobin A1C Recent Labs    01/26/21 0503  HGBA1C 6.3*   Fasting Lipid Panel Recent Labs    01/26/21 0503  CHOL 165  HDL 45  LDLCALC 104*  TRIG 81  CHOLHDL 3.7   Thyroid Function Tests Recent Labs    01/26/21 0503  TSH 0.784    Telemetry    Normal sinus rhythm  ECG    Normal sinus rhythm with  LVH  Radiology    US Carotid Bilateral  Result Date: 01/26/2021 CLINICAL DATA:  Syncope EXAM: BILATERAL CAROTID DUPLEX ULTRASOUND TECHNIQUE: Wallace Cullens scale imaging, color Doppler and duplex ultrasound were performed of bilateral carotid and vertebral arteries in the neck. COMPARISON:  None. FINDINGS: Criteria: Quantification of carotid stenosis is based on velocity parameters that correlate the residual internal carotid diameter with NASCET-based stenosis levels, using the diameter of the distal internal carotid lumen as the denominator for stenosis measurement. The following velocity measurements were obtained: RIGHT ICA: 61/15 cm/sec CCA: 105/13 cm/sec SYSTOLIC ICA/CCA RATIO:  0.7 ECA: 60 cm/sec LEFT ICA: 50/12 cm/sec CCA: 60/19 cm/sec SYSTOLIC ICA/CCA RATIO:  0.8 ECA: 73 cm/sec RIGHT CAROTID ARTERY: No significant plaque formation or stenosis. RIGHT VERTEBRAL ARTERY:  Antegrade flow LEFT CAROTID ARTERY:  No significant plaque formation or stenosis. LEFT VERTEBRAL ARTERY:  Antegrade flow IMPRESSION: No significant plaque formation or stenosis bilaterally. Electronically Signed   By: Charlett Nose M.D.   On: 01/26/2021 00:19   DG Chest Port 1 View  Result Date: 01/25/2021 CLINICAL DATA:  Syncope episode EXAM: PORTABLE CHEST 1 VIEW COMPARISON:  11/02/2020 FINDINGS: Cardiomegaly with vascular congestion and mild diffuse interstitial process likely edema. No pleural effusion, pneumothorax, or focal airspace disease IMPRESSION: Cardiomegaly with vascular congestion and mild diffuse interstitial opacity, probably representing mild edema Electronically Signed   By: Jasmine Pang M.D.   On: 01/25/2021 21:49    Assessment & Plan    85 year old female who unfortunately is homeless.  She has a storage unit and uses the facility bathroom or restaurant bathrooms for care.  She lives in a car since April 2022.  She has moderate severe aortic stenosis.  Is not a candidate for TAVR at present.  Etiology of event is  unclear.  No evidence of ischemia or obvious dysrhythmia.  Would continue with current medical regimen.  Will need social service evaluation prior to discharge due to her disposition.  Patient had an echo less than a month ago.  We will continue with current regimen and follow-up with her care team and [facility].  Signed, Darlin Priestly Aileana Hodder MD 01/27/2021, 4:55 PM  Pager: [phone]

## 2021-01-27 NOTE — Progress Notes (Signed)
PROGRESS NOTE    Alexandria Goodman  HER:[id number] DOB: [birth year] DOA: 01/25/2021 PCP: Pcp, No    Brief Narrative:  Alexandria Goodman is a 85 y.o. female with medical history significant of   HTN, p A.fib on Eliquis, diastolic CHF, obesity, aortic stenosis who presented with  syncope.  Patient currently homeless, she lives in a old car.  It was very hot yesterday, she was diaphoretic, she blacked out. She also had a history of moderate aortic stenosis. Patient is a seen by cardiology, her syncope episode appeared to be due to dehydration with aortic stenosis.  Patient leaving the very hot environment in her car. Social worker is working on possible placement.   Assessment & Plan:   Active Problems:   AF (paroxysmal atrial fibrillation) (HCC)   Essential hypertension   Obesity, Class III, BMI 40-49.9 (morbid obesity) (HCC)   Adjustment disorder with mixed disturbance of emotions and conduct   Aortic stenosis   Syncope and collapse   Homelessness   Obesity (BMI 35.0-39.9 without comorbidity)  Patient condition has been stable.  Her syncope episode was due to aortic stenosis in addition to dehydration in a very hot environment.  There is no additional treatment or work-up per cardiology. Currently, patient refused to go back to her car.  I had discussed with the social worker, will try to see if we can help with her placement.  She is also being seen by PT/OT, recommended nursing home placement.  I advised the social worker to start working on it.  However, patient does not meet inpatient criteria.    DVT prophylaxis: eliquis Code Status:  Family Communication:  Disposition Plan:    Status is: Observation          I/O last 3 completed shifts: In: 316.3 [P.O.:240; I.V.:76.3] Out: 1700 [Urine:1700] Total I/O In: 120 [P.O.:120] Out: 150 [Urine:150]     Consultants:  none  Procedures: none  Antimicrobials: none Subjective: Patient is upset that we could not keep her  for a long time.  She states that that she should be in the hospital due to her heart condition and her joint pain. He states that that she could not sleep well last night because her joint.  However, she does not have any short of breath, no chest pain. No abdominal pain nausea vomiting No dysuria hematuria pain No fever or chills.  Objective: Vitals:   01/27/21 0345 01/27/21 0726 01/27/21 0844 01/27/21 1131  BP: (!) 141/65  136/67 (!) 145/56  Pulse: 66  68 61  Resp: 16 18 16 18   Temp: 97.7 F (36.5 C)  97.6 F (36.4 C) 98.7 F (37.1 C)  TempSrc: Oral  Oral Oral  SpO2: 94%  96% 96%  Weight:      Height:        Intake/Output Summary (Last 24 hours) at 01/27/2021 1202 Last data filed at 01/27/2021 1143 Gross per 24 hour  Intake 360 ml  Output 750 ml  Net -390 ml   Filed Weights   01/25/21 1725  Weight: 85.3 kg    Examination:  General exam: Appears calm and comfortable  Respiratory system: Clear to auscultation. Respiratory effort normal. Cardiovascular system: S1 & S2 heard, RRR. No JVD, murmurs, rubs, gallops or clicks. No pedal edema. Gastrointestinal system: Abdomen is nondistended, soft and nontender. No organomegaly or masses felt. Normal bowel sounds heard. Central nervous system: Alert and oriented. No focal neurological deficits. Extremities: Symmetric 5 x 5 power. Skin: No rashes, lesions or ulcers  Psychiatry: Mood & affect appropriate.     Data Reviewed: I have personally reviewed following labs and imaging studies  CBC: Recent Labs  Lab 01/25/21 1728 01/26/21 0503  WBC 5.4 5.7  NEUTROABS  --  3.6  HGB 12.0 11.4*  HCT 35.9* 34.1*  MCV 96.8 94.7  PLT 123* 115*   Basic Metabolic Panel: Recent Labs  Lab 01/25/21 1728 01/26/21 0503 01/27/21 0534  NA 140 143 141  K 3.7 3.4* 4.3  CL 101 101 101  CO2 34* 33* 35*  GLUCOSE 169* 122* 119*  BUN 15 12 14   CREATININE 0.72 0.59 0.81  CALCIUM 8.7* 8.3* 8.9  MG 1.8 1.8 1.9  PHOS 3.5 3.5  --     GFR: Estimated Creatinine Clearance: 44.1 mL/min (by C-G formula based on SCr of 0.81 mg/dL). Liver Function Tests: Recent Labs  Lab 01/25/21 2016 01/26/21 0503  AST 62* 44*  ALT 37 31  ALKPHOS 176* 151*  BILITOT 1.1 1.1  PROT 6.1* 5.9*  ALBUMIN 3.2* 2.8*   No results for input(s): LIPASE, AMYLASE in the last 168 hours. No results for input(s): AMMONIA in the last 168 hours. Coagulation Profile: No results for input(s): INR, PROTIME in the last 168 hours. Cardiac Enzymes: Recent Labs  Lab 01/25/21 1728  CKTOTAL 51   BNP (last 3 results) No results for input(s): PROBNP in the last 8760 hours. HbA1C: Recent Labs    01/26/21 0503  HGBA1C 6.3*   CBG: No results for input(s): GLUCAP in the last 168 hours. Lipid Profile: Recent Labs    01/26/21 0503  CHOL 165  HDL 45  LDLCALC 104*  TRIG 81  CHOLHDL 3.7   Thyroid Function Tests: Recent Labs    01/26/21 0503  TSH 0.784   Anemia Panel: No results for input(s): VITAMINB12, FOLATE, FERRITIN, TIBC, IRON, RETICCTPCT in the last 72 hours. Sepsis Labs: No results for input(s): PROCALCITON, LATICACIDVEN in the last 168 hours.  Recent Results (from the past 240 hour(s))  SARS CORONAVIRUS 2 (TAT 6-24 HRS) Nasopharyngeal Nasopharyngeal Swab     Status: None   Collection Time: 01/25/21  8:55 PM   Specimen: Nasopharyngeal Swab  Result Value Ref Range Status   SARS Coronavirus 2 NEGATIVE NEGATIVE Final    Comment: (NOTE) SARS-CoV-2 target nucleic acids are NOT DETECTED.  The SARS-CoV-2 RNA is generally detectable in upper and lower respiratory specimens during the acute phase of infection. Negative results do not preclude SARS-CoV-2 infection, do not rule out co-infections with other pathogens, and should not be used as the sole basis for treatment or other patient management decisions. Negative results must be combined with clinical observations, patient history, and epidemiological information. The  expected result is Negative.  Fact Sheet for Patients: 01/27/21  Fact Sheet for Healthcare Providers: HairSlick.no  This test is not yet approved or cleared by the quierodirigir.com FDA and  has been authorized for detection and/or diagnosis of SARS-CoV-2 by FDA under an Emergency Use Authorization (EUA). This EUA will remain  in effect (meaning this test can be used) for the duration of the COVID-19 declaration under Se ction 564(b)(1) of the Act, 21 U.S.C. section 360bbb-3(b)(1), unless the authorization is terminated or revoked sooner.  Performed at [facility], [address], [place], [place]          Radiology Studies: 75643 Carotid Bilateral  Result Date: 01/26/2021 CLINICAL DATA:  Syncope EXAM: BILATERAL CAROTID DUPLEX ULTRASOUND TECHNIQUE: 01/28/2021 scale imaging, color Doppler and duplex  ultrasound were performed of bilateral carotid and vertebral arteries in the neck. COMPARISON:  None. FINDINGS: Criteria: Quantification of carotid stenosis is based on velocity parameters that correlate the residual internal carotid diameter with NASCET-based stenosis levels, using the diameter of the distal internal carotid lumen as the denominator for stenosis measurement. The following velocity measurements were obtained: RIGHT ICA: 61/15 cm/sec CCA: 105/13 cm/sec SYSTOLIC ICA/CCA RATIO:  0.7 ECA: 60 cm/sec LEFT ICA: 50/12 cm/sec CCA: 60/19 cm/sec SYSTOLIC ICA/CCA RATIO:  0.8 ECA: 73 cm/sec RIGHT CAROTID ARTERY: No significant plaque formation or stenosis. RIGHT VERTEBRAL ARTERY:  Antegrade flow LEFT CAROTID ARTERY:  No significant plaque formation or stenosis. LEFT VERTEBRAL ARTERY:  Antegrade flow IMPRESSION: No significant plaque formation or stenosis bilaterally. Electronically Signed   By: Charlett Nose M.D.   On: 01/26/2021 00:19   DG Chest Port 1 View  Result Date: 01/25/2021 CLINICAL DATA:  Syncope episode EXAM:  PORTABLE CHEST 1 VIEW COMPARISON:  11/02/2020 FINDINGS: Cardiomegaly with vascular congestion and mild diffuse interstitial process likely edema. No pleural effusion, pneumothorax, or focal airspace disease IMPRESSION: Cardiomegaly with vascular congestion and mild diffuse interstitial opacity, probably representing mild edema Electronically Signed   By: Jasmine Pang M.D.   On: 01/25/2021 21:49        Scheduled Meds:  apixaban  5 mg Oral Daily   aspirin  81 mg Oral Daily   carvedilol  6.25 mg Oral Daily   Continuous Infusions:   LOS: 0 days    Time spent: 26 minutes    Marrion Coy, MD [facility]   To contact the attending provider between 7A-7P or the covering provider during after hours 7P-7A, please log into the web site [email or web address] and access using universal [place] password for that web site. If you do not have the password, please call the hospital operator.  01/27/2021, 12:02 PM

## 2021-01-28 DIAGNOSIS — R55 Syncope and collapse: Secondary | ICD-10-CM | POA: Diagnosis not present

## 2021-01-28 DIAGNOSIS — I350 Nonrheumatic aortic (valve) stenosis: Secondary | ICD-10-CM | POA: Diagnosis not present

## 2021-01-28 MED ORDER — DULOXETINE HCL 30 MG PO CPEP
60.0000 mg | ORAL_CAPSULE | Freq: Every day | ORAL | Status: DC
  Administered 2021-01-28 – 2021-02-01 (×5): 60 mg via ORAL
  Filled 2021-01-28 (×5): qty 2

## 2021-01-28 MED ORDER — OXYCODONE-ACETAMINOPHEN 5-325 MG PO TABS
1.0000 | ORAL_TABLET | Freq: Once | ORAL | Status: AC
  Administered 2021-01-28: 01:00:00 1 via ORAL
  Filled 2021-01-28: qty 1

## 2021-01-28 MED ORDER — LOSARTAN POTASSIUM 50 MG PO TABS
50.0000 mg | ORAL_TABLET | Freq: Every day | ORAL | Status: DC
  Administered 2021-01-28 – 2021-02-01 (×5): 50 mg via ORAL
  Filled 2021-01-28 (×5): qty 1

## 2021-01-28 MED ORDER — MORPHINE SULFATE (PF) 2 MG/ML IV SOLN: 2.0000 mg | INTRAVENOUS | Status: DC | PRN

## 2021-01-28 MED ORDER — POLYETHYLENE GLYCOL 3350 17 G PO PACK
17.0000 g | PACK | Freq: Every day | ORAL | Status: DC
  Administered 2021-01-28 – 2021-02-01 (×4): 17 g via ORAL
  Filled 2021-01-28 (×4): qty 1

## 2021-01-28 NOTE — NC FL2 (Signed)
[place] MEDICAID FL2 LEVEL OF CARE SCREENING TOOL     IDENTIFICATION  Patient Name: Alexandria Goodman Birthdate: 10/03/1935 Sex: female Admission Date (Current Location): 01/25/2021  [facility] and [place] Number:      Facility and Address:  [facility], [address], [place], [place] [address]      Provider Number: [id number]  Attending Physician Name and Address:  Marrion Coy, MD  Relative Name and Phone Number:       Current Level of Care: Hospital Recommended Level of Care: Skilled Nursing Facility Prior Approval Number:    Date Approved/Denied:   PASRR Number: pending  Discharge Plan: SNF    Current Diagnoses: Patient Active Problem List   Diagnosis Date Noted   Hypokalemia 01/27/2021   Syncope and collapse 01/25/2021   Homelessness 01/25/2021   Obesity (BMI 35.0-39.9 without comorbidity) 01/25/2021   MDD (major depressive disorder), single episode, moderate (HCC) 11/02/2020   Atypical chest pain 11/02/2020   AF (paroxysmal atrial fibrillation) (HCC) 11/02/2020   Essential hypertension 11/02/2020   Obesity, Class III, BMI 40-49.9 (morbid obesity) (HCC) 11/02/2020   Adjustment disorder with mixed disturbance of emotions and conduct 11/02/2020   Aortic stenosis 11/02/2020    Orientation RESPIRATION BLADDER Height & Weight     Self, Time, Situation, Place  Normal Continent, External catheter Weight: 85.3 kg Height:  4\' 6"  (137.2 cm)  BEHAVIORAL SYMPTOMS/MOOD NEUROLOGICAL BOWEL NUTRITION STATUS      Continent Diet  AMBULATORY STATUS COMMUNICATION OF NEEDS Skin   Limited Assist Verbally Normal                       Personal Care Assistance Level of Assistance  Bathing, Feeding, Dressing Bathing Assistance: Limited assistance Feeding assistance: Independent Dressing Assistance: Limited assistance     Functional Limitations Info  Hearing, Sight Sight Info: Impaired Hearing Info: Impaired      SPECIAL CARE FACTORS  FREQUENCY  PT (By licensed PT), OT (By licensed OT)     PT Frequency: 5 times per week OT Frequency: 5 times per week            Contractures Contractures Info: Not present    Additional Factors Info  Code Status, Allergies Code Status Info: DNR Allergies Info: Alendronate, dilaudid, monosodium glutamate, levofloxacin, lisinopril, morhpine and related, pravastatin, solinfenacin, tradjenta, trospium, augmentin, sulfa           Current Medications (01/28/2021):  This is the current hospital active medication list Current Facility-Administered Medications  Medication Dose Route Frequency Provider Last Rate Last Admin   acetaminophen (TYLENOL) tablet 650 mg  650 mg Oral Q6H PRN 01/30/2021, MD   650 mg at 01/26/21 0247   Or   acetaminophen (TYLENOL) suppository 650 mg  650 mg Rectal Q6H PRN 01/28/21, MD       apixaban (ELIQUIS) tablet 5 mg  5 mg Oral Daily Doutova, Anastassia, MD   5 mg at 01/28/21 0751   aspirin chewable tablet 81 mg  81 mg Oral Daily Doutova, Anastassia, MD   81 mg at 01/28/21 0750   carvedilol (COREG) tablet 6.25 mg  6.25 mg Oral Daily Doutova, Anastassia, MD   6.25 mg at 01/28/21 0750   DULoxetine (CYMBALTA) DR capsule 60 mg  60 mg Oral Daily 01/30/21, MD   60 mg at 01/28/21 1111   losartan (COZAAR) tablet 50 mg  50 mg Oral Daily 01/30/21, MD   50 mg at 01/28/21 1111   morphine  2 MG/ML injection 2 mg  2 mg Intravenous Q4H PRN Mansy, Jan A, MD       oxyCODONE-acetaminophen (PERCOCET) 7.5-325 MG per tablet 1 tablet  1 tablet Oral Q6H PRN Therisa Doyne, MD   1 tablet at 01/28/21 1516   polyethylene glycol (MIRALAX / GLYCOLAX) packet 17 g  17 g Oral Daily Marrion Coy, MD   17 g at 01/28/21 0909     Discharge Medications: Please see discharge summary for a list of discharge medications.  Relevant Imaging Results:  Relevant Lab Results:   Additional Information SS# [id number]  Allayne Butcher, RN

## 2021-01-28 NOTE — TOC Progression Note (Signed)
Transition of Care [facility]) - Progression Note    Patient Details  Name: Alexandria Goodman MRN: [id number] Date of Birth: 31-Oct-1935  Transition of [facility]) CM/SW Contact  Allayne Butcher, RN Phone Number: 01/28/2021, 3:42 PM  Clinical Narrative:    Patient has no medical necessity for hospitalization at this time but PT and OT have recommended SNF and patient agrees to workup for SNF for rehab.  Patient's ultimate goal is to get back to [facility] where she was living before she moved to live with her daughter.  Bed search started [place] pending.     Expected Discharge Plan:  (Car/Shelter) Barriers to Discharge: Continued Medical Work up  Expected Discharge Plan and Services Expected Discharge Plan:  (Car/Shelter)     Post Acute Care Choice: NA Living arrangements for the past 2 months: Homeless                                       Social Determinants of Health (SDOH) Interventions    Readmission Risk Interventions No flowsheet data found.

## 2021-01-28 NOTE — Progress Notes (Signed)
   01/28/21 0900  Clinical Encounter Type  Visited With Patient  Visit Type Initial;Spiritual support  Referral From Nurse  Consult/Referral To Chaplain  Spiritual Encounters  Spiritual Needs Prayer;Emotional  Stress Factors  Patient Stress Factors Lack of caregivers;Loss of control  Chaplain Nasri Boakye did an AD education and prayer for pt. Pt was not able to complete the AD at this time.

## 2021-01-28 NOTE — Progress Notes (Signed)
Occupational Therapy Treatment Patient Details Name: Alexandria Goodman MRN: [id number] DOB: 10-04-1935 Today's Date: 01/28/2021    History of present illness Pt is an 85 y.o. female with medical history significant of HTN, A.fib, diastolic CHF, obesity, and aortic stenosis.  MD assessment includes: Syncope and collapse, homelessness, and hypokalemia.   OT comments  Upon entering the room, pt supine in bed with no c/o pain this session. Pt was agreeable to OT intervention with encouragement. She declined toileting needs and bathing tasks. Pt donned B socks with set up A from bed level. Supine >sit with min guard to EOB. OT assisted pt with donning brief in bed secondary to concerns for urinary incontinence with standing. Pt standing with RW with min guard and taking several steps forwards to sink. Pt adjusting brief with min guard for balance. Grooming tasks while standing for ~ 10 minutes with close supervision for safety. Pt ambulating in room only 30' with RW and min guard this session for safety. Pt returning to sitting on EOB and requesting drink. RN arrived with medication and pt remained seated with RN in room. All needs within reach. Pt continues to benefit from OT intervention.   Follow Up Recommendations  SNF    Equipment Recommendations  Other (comment) (defer to next venue of care)    Recommendations for Other Services      Precautions / Restrictions Precautions Precautions: Fall       Mobility Bed Mobility Overal bed mobility: Needs Assistance Bed Mobility: Supine to Sit     Supine to sit: Min guard     General bed mobility comments: min guard for balance    Transfers Overall transfer level: Needs assistance Equipment used: Rolling walker (2 wheeled) Transfers: Sit to/from Stand Sit to Stand: Min guard Stand pivot transfers: Min guard       General transfer comment: min guard and min cuing for techniqe    Balance Overall balance assessment: Needs  assistance Sitting-balance support: Feet supported Sitting balance-Leahy Scale: Good     Standing balance support: Bilateral upper extremity supported;During functional activity Standing balance-Leahy Scale: Fair                             ADL either performed or assessed with clinical judgement   ADL Overall ADL's : Needs assistance/impaired     Grooming: Wash/dry hands;Oral care;Standing;Supervision/safety               Lower Body Dressing: Min guard;Sit to/from stand                       Vision Baseline Vision/History: Wears glasses Patient Visual Report: No change from baseline            Cognition Arousal/Alertness: Awake/alert Behavior During Therapy: WFL for tasks assessed/performed Overall Cognitive Status: Within Functional Limits for tasks assessed                                                     Pertinent Vitals/ Pain       Pain Assessment: 0-10 Pain Score: 4  Pain Location: R LE Pain Descriptors / Indicators: Aching;Discomfort Pain Intervention(s): Monitored during session;Premedicated before session;Repositioned   Frequency  Min 2X/week        Progress Toward Goals  OT Goals(current  goals can now be found in the care plan section)  Progress towards OT goals: Progressing toward goals  Acute Rehab OT Goals Patient Stated Goal: To feel better and have a home OT Goal Formulation: With patient Time For Goal Achievement: 02/09/21 Potential to Achieve Goals: Fair  Plan Discharge plan remains appropriate       AM-PAC OT "6 Clicks" Daily Activity     Outcome Measure   Help from another person eating meals?: None Help from another person taking care of personal grooming?: None Help from another person toileting, which includes using toliet, bedpan, or urinal?: A Little Help from another person bathing (including washing, rinsing, drying)?: A Little Help from another person to put on and taking  off regular upper body clothing?: None Help from another person to put on and taking off regular lower body clothing?: A Little 6 Click Score: 21    End of Session Equipment Utilized During Treatment: Rolling walker  OT Visit Diagnosis: Unsteadiness on feet (R26.81);Repeated falls (R29.6);Muscle weakness (generalized) (M62.81)   Activity Tolerance Patient tolerated treatment well   Patient Left in bed;with call bell/phone within reach;with nursing/sitter in room   Nurse Communication Mobility status        Time: 2458-0998 OT Time Calculation (min): 38 min  Charges: OT General Charges $OT Visit: 1 Visit OT Treatments $Self Care/Home Management : 8-22 mins $Therapeutic Activity: 23-37 mins  Jackquline Denmark, MS, OTR/L , CBIS ascom [phone]  01/28/21, 11:29 AM

## 2021-01-28 NOTE — Progress Notes (Signed)
PROGRESS NOTE    Alexandria Goodman  HYW:[id number] DOB: November 30, 1935 DOA: 01/25/2021 PCP: Pcp, No   CC.  Syncope. Brief Narrative:  Alexandria Goodman is a 85 y.o. female with medical history significant of   HTN, p A.fib on Eliquis, diastolic CHF, obesity, aortic stenosis who presented with  syncope.  Patient currently homeless, she lives in a old car.  It was very hot yesterday, she was diaphoretic, she blacked out. She also had a history of moderate aortic stenosis. Patient is a seen by cardiology, her syncope episode appeared to be due to dehydration with aortic stenosis.  Patient leaving the very hot environment in her car. Social worker is working on possible placement.   Assessment & Plan:   Active Problems:   AF (paroxysmal atrial fibrillation) (HCC)   Essential hypertension   Obesity, Class III, BMI 40-49.9 (morbid obesity) (HCC)   Adjustment disorder with mixed disturbance of emotions and conduct   Aortic stenosis   Syncope and collapse   Homelessness   Obesity (BMI 35.0-39.9 without comorbidity)   Hypokalemia  Patient has been seen by PT/OT, deemed needing SNF placement.  Discussed with the social worker, she will start insurance preauthorization.  In the meantime, it is unsafe to discharge patient, we will keep her until insurance decision is made. Blood pressure running high, restart losartan. She has some thrombocytopenia, but she did not take any heparin product, she is on Eliquis.   DVT prophylaxis: Eliquis Code Status: Full Family Communication:  Disposition Plan:    Status is: Observation         I/O last 3 completed shifts: In: 720 [P.O.:720] Out: 1800 [Urine:1800] No intake/output data recorded.     Consultants:  None  Procedures: None  Antimicrobials: None  Subjective: Patient still complaining of bilateral knee pain, she has some weakness for physical therapy.  Denies any chest pain or shortness of breath. Denies any abdominal pain or nausea  vomiting. No dysuria hematuria.  Objective: Vitals:   01/27/21 1131 01/27/21 2109 01/28/21 0447 01/28/21 0755  BP: (!) 145/56 (!) 180/84 (!) 151/68 (!) 159/69  Pulse: 61 80 79 85  Resp: 18 20 16 20   Temp: 98.7 F (37.1 C) 97.9 F (36.6 C) 97.8 F (36.6 C) 99.1 F (37.3 C)  TempSrc: Oral Oral Oral   SpO2: 96% 98% 95% 95%  Weight:      Height:        Intake/Output Summary (Last 24 hours) at 01/28/2021 1055 Last data filed at 01/28/2021 0447 Gross per 24 hour  Intake 360 ml  Output 1200 ml  Net -840 ml   Filed Weights   01/25/21 1725  Weight: 85.3 kg    Examination:  General exam: Appears calm and comfortable  Respiratory system: Clear to auscultation. Respiratory effort normal. Cardiovascular system: S1 & S2 heard, RRR. No JVD, murmurs, rubs, gallops or clicks. No pedal edema. Gastrointestinal system: Abdomen is nondistended, soft and nontender. No organomegaly or masses felt. Normal bowel sounds heard. Central nervous system: Alert and oriented. No focal neurological deficits. Extremities: Symmetric 5 x 5 power. Skin: No rashes, lesions or ulcers Psychiatry: Judgement and insight appear normal. Mood & affect appropriate.     Data Reviewed: I have personally reviewed following labs and imaging studies  CBC: Recent Labs  Lab 01/25/21 1728 01/26/21 0503  WBC 5.4 5.7  NEUTROABS  --  3.6  HGB 12.0 11.4*  HCT 35.9* 34.1*  MCV 96.8 94.7  PLT 123* 115*   Basic Metabolic Panel:  Recent Labs  Lab 01/25/21 1728 01/26/21 0503 01/27/21 0534  NA 140 143 141  K 3.7 3.4* 4.3  CL 101 101 101  CO2 34* 33* 35*  GLUCOSE 169* 122* 119*  BUN 15 12 14   CREATININE 0.72 0.59 0.81  CALCIUM 8.7* 8.3* 8.9  MG 1.8 1.8 1.9  PHOS 3.5 3.5  --    GFR: Estimated Creatinine Clearance: 44.1 mL/min (by C-G formula based on SCr of 0.81 mg/dL). Liver Function Tests: Recent Labs  Lab 01/25/21 2016 01/26/21 0503  AST 62* 44*  ALT 37 31  ALKPHOS 176* 151*  BILITOT 1.1 1.1   PROT 6.1* 5.9*  ALBUMIN 3.2* 2.8*   No results for input(s): LIPASE, AMYLASE in the last 168 hours. No results for input(s): AMMONIA in the last 168 hours. Coagulation Profile: No results for input(s): INR, PROTIME in the last 168 hours. Cardiac Enzymes: Recent Labs  Lab 01/25/21 1728  CKTOTAL 51   BNP (last 3 results) No results for input(s): PROBNP in the last 8760 hours. HbA1C: Recent Labs    01/26/21 0503  HGBA1C 6.3*   CBG: Recent Labs  Lab 01/27/21 2149  GLUCAP 123*   Lipid Profile: Recent Labs    01/26/21 0503  CHOL 165  HDL 45  LDLCALC 104*  TRIG 81  CHOLHDL 3.7   Thyroid Function Tests: Recent Labs    01/26/21 0503  TSH 0.784   Anemia Panel: No results for input(s): VITAMINB12, FOLATE, FERRITIN, TIBC, IRON, RETICCTPCT in the last 72 hours. Sepsis Labs: No results for input(s): PROCALCITON, LATICACIDVEN in the last 168 hours.  Recent Results (from the past 240 hour(s))  SARS CORONAVIRUS 2 (TAT 6-24 HRS) Nasopharyngeal Nasopharyngeal Swab     Status: None   Collection Time: 01/25/21  8:55 PM   Specimen: Nasopharyngeal Swab  Result Value Ref Range Status   SARS Coronavirus 2 NEGATIVE NEGATIVE Final    Comment: (NOTE) SARS-CoV-2 target nucleic acids are NOT DETECTED.  The SARS-CoV-2 RNA is generally detectable in upper and lower respiratory specimens during the acute phase of infection. Negative results do not preclude SARS-CoV-2 infection, do not rule out co-infections with other pathogens, and should not be used as the sole basis for treatment or other patient management decisions. Negative results must be combined with clinical observations, patient history, and epidemiological information. The expected result is Negative.  Fact Sheet for Patients: 01/27/21  Fact Sheet for Healthcare Providers: HairSlick.no  This test is not yet approved or cleared by the quierodirigir.com FDA  and  has been authorized for detection and/or diagnosis of SARS-CoV-2 by FDA under an Emergency Use Authorization (EUA). This EUA will remain  in effect (meaning this test can be used) for the duration of the COVID-19 declaration under Se ction 564(b)(1) of the Act, 21 U.S.C. section 360bbb-3(b)(1), unless the authorization is terminated or revoked sooner.  Performed at [facility], [address] [place], [place]          Radiology Studies: No results found.      Scheduled Meds:  apixaban  5 mg Oral Daily   aspirin  81 mg Oral Daily   carvedilol  6.25 mg Oral Daily   DULoxetine  60 mg Oral Daily   losartan  50 mg Oral Daily   polyethylene glycol  17 g Oral Daily   Continuous Infusions:   LOS: 0 days    Time spent: 23 minutes    [id number], MD [facility]  To contact the attending provider between 7A-7P or the covering provider during after hours 7P-7A, please log into the web site [email or web address] and access using universal [place] password for that web site. If you do not have the password, please call the hospital operator.  01/28/2021, 10:55 AM

## 2021-01-28 NOTE — TOC Progression Note (Signed)
Transition of Care [facility]) - Progression Note    Patient Details  Name: Alexandria Goodman MRN: [id number] Date of Birth: 1936/07/05  [facility] CM/SW Contact  Hetty Ely, RN Phone Number: 01/28/2021, 8:51 AM  Clinical Narrative:  Late entry documentation on 01/27/21, spoke with patient about discharge housing plans. Patient states she was living with daughter paying $1,000 month and was put out, which lead to her living in her car. Unable to find housing due to prolonged waiting lists at Senior apartments. [facility] the previous apartment where she lived for eleven years. Patient says she need a place to stay, did agree with SNF placement so she can [place] of the [place]. An extensive search calling multiple apartments, [place], [place] and [employer] housing authorities, no vacancies or long [employer]. Called [facility] and was referred to Karsten Fells had immediate vacancy, 1br $169mo not including utilites. Will continue to search and update patient. Patient states her Car is on Hospital campus now and she is not sure how long it will hold up, 85years old.    Expected Discharge Plan:  (Car/Shelter) Barriers to Discharge: Continued Medical Work up  Expected Discharge Plan and Services Expected Discharge Plan:  (Car/Shelter)     Post Acute Care Choice: NA Living arrangements for the past 2 months: Homeless                                       Social Determinants of Health (SDOH) Interventions    Readmission Risk Interventions No flowsheet data found.

## 2021-01-29 DIAGNOSIS — R55 Syncope and collapse: Secondary | ICD-10-CM | POA: Diagnosis not present

## 2021-01-29 DIAGNOSIS — I350 Nonrheumatic aortic (valve) stenosis: Secondary | ICD-10-CM | POA: Diagnosis not present

## 2021-01-29 LAB — TROPONIN I (HIGH SENSITIVITY)
Troponin I (High Sensitivity): 19 ng/L — ABNORMAL HIGH (ref <18)
Troponin I (High Sensitivity): 21 ng/L — ABNORMAL HIGH (ref <18)

## 2021-01-29 MED ORDER — SENNOSIDES-DOCUSATE SODIUM 8.6-50 MG PO TABS
2.0000 | ORAL_TABLET | Freq: Two times a day (BID) | ORAL | Status: DC
  Administered 2021-01-29 – 2021-01-31 (×5): 2 via ORAL
  Filled 2021-01-29 (×7): qty 2

## 2021-01-29 MED ORDER — LACTULOSE 10 GM/15ML PO SOLN
20.0000 g | Freq: Once | ORAL | Status: AC
  Administered 2021-01-29: 22:00:00 20 g via ORAL
  Filled 2021-01-29: qty 30

## 2021-01-29 MED ORDER — BUMETANIDE 1 MG PO TABS
2.0000 mg | ORAL_TABLET | Freq: Every day | ORAL | Status: DC
  Administered 2021-01-29 – 2021-02-01 (×4): 2 mg via ORAL
  Filled 2021-01-29 (×4): qty 2

## 2021-01-29 MED ORDER — PANTOPRAZOLE SODIUM 40 MG PO TBEC
40.0000 mg | DELAYED_RELEASE_TABLET | Freq: Every day | ORAL | Status: DC
  Administered 2021-01-29 – 2021-02-01 (×4): 40 mg via ORAL
  Filled 2021-01-29 (×4): qty 1

## 2021-01-29 NOTE — Progress Notes (Signed)
PROGRESS NOTE    Alexandria Goodman  YTK:[id number] DOB: May 01, 1936 DOA: 01/25/2021 PCP: Pcp, No   CC: syncope Brief Narrative:  Alexandria Goodman is a 85 y.o. female with medical history significant of   HTN, p A.fib on Eliquis, diastolic CHF, obesity, aortic stenosis who presented with  syncope.  Patient currently homeless, she lives in a old car.  It was very hot yesterday, she was diaphoretic, she blacked out. She also had a history of moderate aortic stenosis. Patient is a seen by cardiology, her syncope episode appeared to be due to dehydration with aortic stenosis.  Patient leaving the very hot environment in her car. Social worker is working on possible placement.   Assessment & Plan:   Active Problems:   AF (paroxysmal atrial fibrillation) (HCC)   Essential hypertension   Obesity, Class III, BMI 40-49.9 (morbid obesity) (HCC)   Adjustment disorder with mixed disturbance of emotions and conduct   Aortic stenosis   Syncope and collapse   Homelessness   Obesity (BMI 35.0-39.9 without comorbidity)   Hypokalemia   Patient still pending SNF placement.  Had some abdominal discomfort, Protonix added.  Restart Bumex at home medicine.    DVT prophylaxis: Eliquis Code Status: Full Family Communication:  Disposition Plan:    Status is: Observation    Dispo: The patient is from: Home              Anticipated d/c is to: SNF              Patient currently is medically stable to d/c.   Difficult to place patient No        I/O last 3 completed shifts: In: 360 [P.O.:360] Out: 1350 [Urine:1350] Total I/O In: 120 [P.O.:120] Out: -      Consultants:  none  Procedures: none  Antimicrobials: none  Subjective: Patient slept well last night.  She complaining some upper stomach discomfort last night.  No nausea vomiting.  Pain is better.  Objective: Vitals:   01/28/21 2037 01/28/21 2321 01/29/21 0621 01/29/21 0916  BP: (!) 142/74 (!) 158/85 130/65 135/64  Pulse: 79  91 70 68  Resp: 18 20 18 18   Temp: 98.8 F (37.1 C) 98.5 F (36.9 C) 97.8 F (36.6 C) (!) 97.5 F (36.4 C)  TempSrc: Oral Oral Oral Oral  SpO2: 95% 93% 92% 91%  Weight:      Height:        Intake/Output Summary (Last 24 hours) at 01/29/2021 1023 Last data filed at 01/29/2021 0900 Gross per 24 hour  Intake 360 ml  Output 300 ml  Net 60 ml   Filed Weights   01/25/21 1725  Weight: 85.3 kg    Examination:  General exam: Appears calm and comfortable  Respiratory system: Clear to auscultation. Respiratory effort normal. Cardiovascular system: S1 & S2 heard, RRR. No JVD, murmurs, rubs, gallops or clicks. No pedal edema. Gastrointestinal system: Abdomen is nondistended, soft and nontender. No organomegaly or masses felt. Normal bowel sounds heard. Central nervous system: Alert and oriented. No focal neurological deficits. Extremities: Symmetric 5 x 5 power. Skin: No rashes, lesions or ulcers Psychiatry: Judgement and insight appear normal. Mood & affect appropriate.     Data Reviewed: I have personally reviewed following labs and imaging studies  CBC: Recent Labs  Lab 01/25/21 1728 01/26/21 0503  WBC 5.4 5.7  NEUTROABS  --  3.6  HGB 12.0 11.4*  HCT 35.9* 34.1*  MCV 96.8 94.7  PLT 123* 115*   Basic  Metabolic Panel: Recent Labs  Lab 01/25/21 1728 01/26/21 0503 01/27/21 0534  NA 140 143 141  K 3.7 3.4* 4.3  CL 101 101 101  CO2 34* 33* 35*  GLUCOSE 169* 122* 119*  BUN 15 12 14   CREATININE 0.72 0.59 0.81  CALCIUM 8.7* 8.3* 8.9  MG 1.8 1.8 1.9  PHOS 3.5 3.5  --    GFR: Estimated Creatinine Clearance: 44.1 mL/min (by C-G formula based on SCr of 0.81 mg/dL). Liver Function Tests: Recent Labs  Lab 01/25/21 2016 01/26/21 0503  AST 62* 44*  ALT 37 31  ALKPHOS 176* 151*  BILITOT 1.1 1.1  PROT 6.1* 5.9*  ALBUMIN 3.2* 2.8*   No results for input(s): LIPASE, AMYLASE in the last 168 hours. No results for input(s): AMMONIA in the last 168 hours. Coagulation  Profile: No results for input(s): INR, PROTIME in the last 168 hours. Cardiac Enzymes: Recent Labs  Lab 01/25/21 1728  CKTOTAL 51   BNP (last 3 results) No results for input(s): PROBNP in the last 8760 hours. HbA1C: No results for input(s): HGBA1C in the last 72 hours. CBG: Recent Labs  Lab 01/27/21 2149  GLUCAP 123*   Lipid Profile: No results for input(s): CHOL, HDL, LDLCALC, TRIG, CHOLHDL, LDLDIRECT in the last 72 hours. Thyroid Function Tests: No results for input(s): TSH, T4TOTAL, FREET4, T3FREE, THYROIDAB in the last 72 hours. Anemia Panel: No results for input(s): VITAMINB12, FOLATE, FERRITIN, TIBC, IRON, RETICCTPCT in the last 72 hours. Sepsis Labs: No results for input(s): PROCALCITON, LATICACIDVEN in the last 168 hours.  Recent Results (from the past 240 hour(s))  SARS CORONAVIRUS 2 (TAT 6-24 HRS) Nasopharyngeal Nasopharyngeal Swab     Status: None   Collection Time: 01/25/21  8:55 PM   Specimen: Nasopharyngeal Swab  Result Value Ref Range Status   SARS Coronavirus 2 NEGATIVE NEGATIVE Final    Comment: (NOTE) SARS-CoV-2 target nucleic acids are NOT DETECTED.  The SARS-CoV-2 RNA is generally detectable in upper and lower respiratory specimens during the acute phase of infection. Negative results do not preclude SARS-CoV-2 infection, do not rule out co-infections with other pathogens, and should not be used as the sole basis for treatment or other patient management decisions. Negative results must be combined with clinical observations, patient history, and epidemiological information. The expected result is Negative.  Fact Sheet for Patients: 01/27/21  Fact Sheet for Healthcare Providers: HairSlick.no  This test is not yet approved or cleared by the [employer]ir[employer]ir.com FDA and  has been authorized for detection and/or diagnosis of SARS-CoV-2 by FDA under an Emergency Use Authorization (EUA). This  EUA will remain  in effect (meaning this test can be used) for the duration of the COVID-19 declaration under Se ction 564(b)(1) of the Act, 21 U.S.C. section 360bbb-3(b)(1), unless the authorization is terminated or revoked sooner.  Performed at [facility], [address] [place], [place]          Radiology Studies: No results found.      Scheduled Meds:  apixaban  5 mg Oral Daily   aspirin  81 mg Oral Daily   bumetanide  2 mg Oral Daily   carvedilol  6.25 mg Oral Daily   DULoxetine  60 mg Oral Daily   losartan  50 mg Oral Daily   pantoprazole  40 mg Oral Daily   polyethylene glycol  17 g Oral Daily   senna-docusate  2 tablet Oral BID   Continuous Infusions:   LOS: 0 days  Time spent: 26 minutes    Marrion Coy, MD [facility]   To contact the attending provider between 7A-7P or the covering provider during after hours 7P-7A, please log into the web site [email or web address] and access using universal [place] password for that web site. If you do not have the password, please call the hospital operator.  01/29/2021, 10:23 AM

## 2021-01-30 DIAGNOSIS — I350 Nonrheumatic aortic (valve) stenosis: Secondary | ICD-10-CM | POA: Diagnosis not present

## 2021-01-30 DIAGNOSIS — R55 Syncope and collapse: Secondary | ICD-10-CM | POA: Diagnosis not present

## 2021-01-30 NOTE — Progress Notes (Signed)
PROGRESS NOTE    Alexandria Goodman  IRW:[id number] DOB: 1936-04-05 DOA: 01/25/2021 PCP: Pcp, No   Chief complaint.  Syncope. Brief Narrative:   Alexandria Goodman is a 85 y.o. female with medical history significant of   HTN, p A.fib on Eliquis, diastolic CHF, obesity, aortic stenosis who presented with  syncope.  Patient currently homeless, she lives in a old car.  It was very hot yesterday, she was diaphoretic, she blacked out. She also had a history of moderate aortic stenosis. Patient is a seen by cardiology, her syncope episode appeared to be due to dehydration with aortic stenosis.  Patient leaving the very hot environment in her car. Social worker is working on possible placement.  Assessment & Plan:   Active Problems:   AF (paroxysmal atrial fibrillation) (HCC)   Essential hypertension   Obesity, Class III, BMI 40-49.9 (morbid obesity) (HCC)   Adjustment disorder with mixed disturbance of emotions and conduct   Aortic stenosis   Syncope and collapse   Homelessness   Obesity (BMI 35.0-39.9 without comorbidity)   Hypokalemia  Patient doing well today, slept well.  No adjustment in medicine.  Pending nursing home placement.    DVT prophylaxis: Eliquis Code Status: Full Family Communication:  Disposition Plan:    Status is: Observation    Dispo: The patient is from: Home              Anticipated d/c is to: SNF              Patient currently is medically stable to d/c.   Difficult to place patient No        I/O last 3 completed shifts: In: 120 [P.O.:120] Out: -  Total I/O In: 360 [P.O.:360] Out: -      Consultants:  none  Procedures: None  Antimicrobials: None  Subjective: No new issue today.  She slept well last night.  Leg pain and controlled. Denies any short of breath or cough. No chest pain or palpitation. No abdominal pain or nausea vomiting.  Had a bowel movement. Objective: Vitals:   01/29/21 2155 01/30/21 0034 01/30/21 0603 01/30/21 0830   BP: (!) 131/56 127/67 (!) 140/57 (!) 152/77  Pulse: 77 70 66 72  Resp: 16 16 16 18   Temp: 98.2 F (36.8 C) 98.2 F (36.8 C) 97.7 F (36.5 C) 98.8 F (37.1 C)  TempSrc: Oral   Oral  SpO2: 94% 98% 94% 94%  Weight:      Height:        Intake/Output Summary (Last 24 hours) at 01/30/2021 1030 Last data filed at 01/30/2021 1008 Gross per 24 hour  Intake 360 ml  Output --  Net 360 ml   Filed Weights   01/25/21 1725  Weight: 85.3 kg    Examination:  General exam: Appears calm and comfortable  Respiratory system: Clear to auscultation. Respiratory effort normal. Cardiovascular system: S1 & S2 heard, RRR. 2/6 SM at RUSB with decreased S2. No pedal edema. Gastrointestinal system: Abdomen is nondistended, soft and nontender. No organomegaly or masses felt. Normal bowel sounds heard. Central nervous system: Alert and oriented. No focal neurological deficits. Extremities: Symmetric 5 x 5 power. Skin: No rashes, lesions or ulcers Psychiatry:  Mood & affect appropriate.     Data Reviewed: I have personally reviewed following labs and imaging studies  CBC: Recent Labs  Lab 01/25/21 1728 01/26/21 0503  WBC 5.4 5.7  NEUTROABS  --  3.6  HGB 12.0 11.4*  HCT 35.9* 34.1*  MCV  96.8 94.7  PLT 123* 115*   Basic Metabolic Panel: Recent Labs  Lab 01/25/21 1728 01/26/21 0503 01/27/21 0534  NA 140 143 141  K 3.7 3.4* 4.3  CL 101 101 101  CO2 34* 33* 35*  GLUCOSE 169* 122* 119*  BUN 15 12 14   CREATININE 0.72 0.59 0.81  CALCIUM 8.7* 8.3* 8.9  MG 1.8 1.8 1.9  PHOS 3.5 3.5  --    GFR: Estimated Creatinine Clearance: 44.1 mL/min (by C-G formula based on SCr of 0.81 mg/dL). Liver Function Tests: Recent Labs  Lab 01/25/21 2016 01/26/21 0503  AST 62* 44*  ALT 37 31  ALKPHOS 176* 151*  BILITOT 1.1 1.1  PROT 6.1* 5.9*  ALBUMIN 3.2* 2.8*   No results for input(s): LIPASE, AMYLASE in the last 168 hours. No results for input(s): AMMONIA in the last 168 hours. Coagulation  Profile: No results for input(s): INR, PROTIME in the last 168 hours. Cardiac Enzymes: Recent Labs  Lab 01/25/21 1728  CKTOTAL 51   BNP (last 3 results) No results for input(s): PROBNP in the last 8760 hours. HbA1C: No results for input(s): HGBA1C in the last 72 hours. CBG: Recent Labs  Lab 01/27/21 2149  GLUCAP 123*   Lipid Profile: No results for input(s): CHOL, HDL, LDLCALC, TRIG, CHOLHDL, LDLDIRECT in the last 72 hours. Thyroid Function Tests: No results for input(s): TSH, T4TOTAL, FREET4, T3FREE, THYROIDAB in the last 72 hours. Anemia Panel: No results for input(s): VITAMINB12, FOLATE, FERRITIN, TIBC, IRON, RETICCTPCT in the last 72 hours. Sepsis Labs: No results for input(s): PROCALCITON, LATICACIDVEN in the last 168 hours.  Recent Results (from the past 240 hour(s))  SARS CORONAVIRUS 2 (TAT 6-24 HRS) Nasopharyngeal Nasopharyngeal Swab     Status: None   Collection Time: 01/25/21  8:55 PM   Specimen: Nasopharyngeal Swab  Result Value Ref Range Status   SARS Coronavirus 2 NEGATIVE NEGATIVE Final    Comment: (NOTE) SARS-CoV-2 target nucleic acids are NOT DETECTED.  The SARS-CoV-2 RNA is generally detectable in upper and lower respiratory specimens during the acute phase of infection. Negative results do not preclude SARS-CoV-2 infection, do not rule out co-infections with other pathogens, and should not be used as the sole basis for treatment or other patient management decisions. Negative results must be combined with clinical observations, patient history, and epidemiological information. The expected result is Negative.  Fact Sheet for Patients: 01/27/21  Fact Sheet for Healthcare Providers: HairSlick.no  This test is not yet approved or cleared by the [employer]ir[employer]ir.com FDA and  has been authorized for detection and/or diagnosis of SARS-CoV-2 by FDA under an Emergency Use Authorization (EUA). This  EUA will remain  in effect (meaning this test can be used) for the duration of the COVID-19 declaration under Se ction 564(b)(1) of the Act, 21 U.S.C. section 360bbb-3(b)(1), unless the authorization is terminated or revoked sooner.  Performed at [facility], [address] [place], [place]          Radiology Studies: No results found.      Scheduled Meds:  apixaban  5 mg Oral Daily   aspirin  81 mg Oral Daily   bumetanide  2 mg Oral Daily   carvedilol  6.25 mg Oral Daily   DULoxetine  60 mg Oral Daily   losartan  50 mg Oral Daily   pantoprazole  40 mg Oral Daily   polyethylene glycol  17 g Oral Daily   senna-docusate  2 tablet Oral BID  Continuous Infusions:   LOS: 0 days    Time spent: 26 minutes    Marrion Coy, MD [facility]   To contact the attending provider between 7A-7P or the covering provider during after hours 7P-7A, please log into the web site [email or web address] and access using universal [place] password for that web site. If you do not have the password, please call the hospital operator.  01/30/2021, 10:30 AM

## 2021-01-30 NOTE — Plan of Care (Signed)
  Problem: Education: Goal: Knowledge of General Education information will improve Description: Including pain rating scale, medication(s)/side effects and non-pharmacologic comfort measures 01/30/2021 0819 by Anastasia Fiedler, RN Outcome: Progressing 01/30/2021 0818 by Milta Deiters A, RN Outcome: Progressing   Problem: Health Behavior/Discharge Planning: Goal: Ability to manage health-related needs will improve 01/30/2021 0819 by Anastasia Fiedler, RN Outcome: Progressing 01/30/2021 0818 by Milta Deiters A, RN Outcome: Progressing   Problem: Clinical Measurements: Goal: Ability to maintain clinical measurements within normal limits will improve 01/30/2021 0819 by Milta Deiters A, RN Outcome: Progressing 01/30/2021 0818 by Milta Deiters A, RN Outcome: Progressing Goal: Will remain free from infection 01/30/2021 0819 by Milta Deiters A, RN Outcome: Progressing 01/30/2021 0818 by Milta Deiters A, RN Outcome: Progressing Goal: Diagnostic test results will improve 01/30/2021 0819 by Anastasia Fiedler, RN Outcome: Progressing 01/30/2021 0818 by Milta Deiters A, RN Outcome: Progressing Goal: Respiratory complications will improve 01/30/2021 0819 by Anastasia Fiedler, RN Outcome: Progressing 01/30/2021 0818 by Milta Deiters A, RN Outcome: Progressing Goal: Cardiovascular complication will be avoided 01/30/2021 0819 by Milta Deiters A, RN Outcome: Progressing 01/30/2021 0818 by Milta Deiters A, RN Outcome: Progressing   Problem: Activity: Goal: Risk for activity intolerance will decrease 01/30/2021 0819 by Milta Deiters A, RN Outcome: Progressing 01/30/2021 0818 by Milta Deiters A, RN Outcome: Progressing   Problem: Nutrition: Goal: Adequate nutrition will be maintained 01/30/2021 0819 by Milta Deiters A, RN Outcome: Progressing 01/30/2021 0818 by Milta Deiters A, RN Outcome: Progressing   Problem: Coping: Goal: Level of anxiety will decrease 01/30/2021 0819 by Milta Deiters A, RN Outcome: Progressing 01/30/2021 0818 by Milta Deiters A, RN Outcome: Progressing   Problem: Elimination: Goal: Will not experience complications related to bowel motility 01/30/2021 0819 by Anastasia Fiedler, RN Outcome: Progressing 01/30/2021 0818 by Milta Deiters A, RN Outcome: Progressing Goal: Will not experience complications related to urinary retention 01/30/2021 0819 by Milta Deiters A, RN Outcome: Progressing 01/30/2021 0818 by Milta Deiters A, RN Outcome: Progressing   Problem: Pain Managment: Goal: General experience of comfort will improve 01/30/2021 0819 by Anastasia Fiedler, RN Outcome: Progressing 01/30/2021 0818 by Milta Deiters A, RN Outcome: Progressing   Problem: Safety: Goal: Ability to remain free from injury will improve 01/30/2021 0819 by Milta Deiters A, RN Outcome: Progressing 01/30/2021 0818 by Milta Deiters A, RN Outcome: Progressing   Problem: Skin Integrity: Goal: Risk for impaired skin integrity will decrease 01/30/2021 0819 by Anastasia Fiedler, RN Outcome: Progressing 01/30/2021 0818 by Anastasia Fiedler, RN Outcome: Progressing

## 2021-01-31 DIAGNOSIS — R55 Syncope and collapse: Secondary | ICD-10-CM | POA: Diagnosis not present

## 2021-01-31 DIAGNOSIS — I350 Nonrheumatic aortic (valve) stenosis: Secondary | ICD-10-CM | POA: Diagnosis not present
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
## 2021-01-31 NOTE — Progress Notes (Signed)
RE: Alexandria Goodman Date of Birth:March 15, 1936  Date: 01/31/2021   To Whom It May Concern:  Please be advised that the above-named patient will require a short-term nursing home stay - anticipated 30 days or less for rehabilitation and strengthening.  The plan is for return home.

## 2021-01-31 NOTE — Progress Notes (Signed)
Physical Therapy Treatment Patient Details Name: Alexandria Goodman MRN: [id number] DOB: 1936-02-26 Today's Date: 01/31/2021    History of Present Illness Pt is an 85 y.o. female with medical history significant of HTN, A.fib, diastolic CHF, obesity, and aortic stenosis.  MD assessment includes: Syncope and collapse, homelessness, and hypokalemia.    PT Comments    Pt found supine with HOB elevated and agreeable to PT services. Pt relies on HOB elevated and bed rail in order to sit EOB.  Pt requires total assist for donning socks and brief due to chronic incontinence. Seated therex performed prior to standing. MinGuard with STS to RW with standing therex performed prior to mobilization. Minguard, pt amb with RW 80' at a slow cadence and BUE support on RW. No LOB noted but decreased stride length appreciated with [pt returned to bed. Pt demonstrates ability to bridge in bed and use BUE's on hand rails to raise herself in bed. Pt progressing in tolerance with functional mobility but is still requiring total assist with don/doffing clothing ADL's such as with socks and briefs and requiring increased time to ambulate short distances with heavy UE support. At this time, current PT recs remain appropriate and pt can benefit from D/c rec below to maximize functional capacity once D/c'd from hospital.    Follow Up Recommendations  SNF     Equipment Recommendations  3in1 (PT)    Recommendations for Other Services       Precautions / Restrictions Precautions Precautions: Fall Restrictions Weight Bearing Restrictions: No    Mobility  Bed Mobility Overal bed mobility: Needs Assistance Bed Mobility: Supine to Sit     Supine to sit: Supervision;HOB elevated Sit to supine: Min guard        Transfers Overall transfer level: Needs assistance Equipment used: Rolling walker (2 wheeled)   Sit to Stand: Min guard            Ambulation/Gait Ambulation/Gait assistance: Min guard Gait  Distance (Feet): 80 Feet Assistive device: Rolling walker (2 wheeled) Gait Pattern/deviations: Step-through pattern;Decreased stride length     General Gait Details: Slowed cadence, no LOB noted. Stayed within [employer]    Modified Rankin (Stroke Patients Only)       Balance Overall balance assessment: Needs assistance Sitting-balance support: Feet supported Sitting balance-Leahy Scale: Good     Standing balance support: Bilateral upper extremity supported;During functional activity Standing balance-Leahy Scale: Fair Standing balance comment: Min lean on RW with walking tasks                            Cognition Arousal/Alertness: Awake/alert Behavior During Therapy: WFL for tasks assessed/performed Overall Cognitive Status: Within Functional Limits for tasks assessed                                        Exercises General Exercises - Lower Extremity Ankle Circles/Pumps: AROM;Both;10 reps;Supine Long Arc Quad: AROM;Strengthening;Both;10 reps;Seated Hip Flexion/Marching: AROM;Standing    General Comments        Pertinent Vitals/Pain Pain Assessment: Faces Faces Pain Scale: Hurts a little bit Pain Location: LE's Pain Descriptors / Indicators: Aching Pain Intervention(s): Monitored during session;Repositioned    Home Living  Prior Function            PT Goals (current goals can now be found in the care plan section) Acute Rehab PT Goals Patient Stated Goal: To feel better and have a home PT Goal Formulation: With patient Time For Goal Achievement: 02/08/21 Potential to Achieve Goals: Good Progress towards PT goals: Progressing toward goals    Frequency    Min 2X/week      PT Plan Current plan remains appropriate    Co-evaluation              AM-PAC PT "6 Clicks" Mobility   Outcome Measure  Help needed turning from your back to your side  while in a flat bed without using bedrails?: A Little Help needed moving from lying on your back to sitting on the side of a flat bed without using bedrails?: A Little Help needed moving to and from a bed to a chair (including a wheelchair)?: A Little Help needed standing up from a chair using your arms (e.g., wheelchair or bedside chair)?: A Little Help needed to walk in hospital room?: A Little Help needed climbing 3-5 steps with a railing? : A Lot 6 Click Score: 17    End of Session Equipment Utilized During Treatment: Gait belt Activity Tolerance: Patient tolerated treatment well Patient left: in bed;with call bell/phone within reach;with bed alarm set Nurse Communication: Mobility status PT Visit Diagnosis: History of falling (Z91.81);Muscle weakness (generalized) (M62.81);Difficulty in walking, not elsewhere classified (R26.2)     Time: 1345-1420 PT Time Calculation (min) (ACUTE ONLY): 35 min  Charges:  $Therapeutic Exercise: 23-37 mins                     Imri Lor M. Fairly IV, PT, DPT Physical Therapist- [place]  [facility]  01/31/2021, 2:54 PM
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
## 2021-02-01 DIAGNOSIS — R55 Syncope and collapse: Secondary | ICD-10-CM | POA: Diagnosis not present

## 2021-02-01 DIAGNOSIS — I350 Nonrheumatic aortic (valve) stenosis: Secondary | ICD-10-CM | POA: Diagnosis not present
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
## 2021-02-01 NOTE — Progress Notes (Signed)
Physical Therapy Treatment Patient Details Name: Alexandria Goodman MRN: [id number] DOB: 06/18/1936 Today's Date: 02/01/2021    History of Present Illness Pt is an 85 y.o. female with medical history significant of HTN, A.fib, diastolic CHF, obesity, and aortic stenosis.  MD assessment includes: Syncope and collapse, homelessness, and hypokalemia.    PT Comments    Pt received sleeping in supine. Aroused via light touch on RLE and agreeable to PT services. Mod-I to t/f from supine to seated EOB. Mod-I to don/doff socks. Pt did soil brief while in sitting. Mod- I STS to RW and able to amb and t/f to sitting on toilet. PT set up for donning/new gown but pt able to independently remove soiled brief. Pt able to stand from lowered toilet with SUE support from toilet with Mod-I to RW and performed pericare with SUE support on RW. Amb 200' with RW with consistent cadence and light touch on RW for stability. No LOB appreciated and able to hold conversation dual task while walking with Mod-I. Pt mod-I to return from standing to supine in bed. Pt appears to be at baseline function for functional mobility and does not appear to require PT needs from SNF at this time. D/c recs changed to no PT recs for f/u once discharged from hospital based off pt appearing to be at baseline function.    Follow Up Recommendations  No PT follow up     Equipment Recommendations       Recommendations for Other Services       Precautions / Restrictions Precautions Precautions: Fall Restrictions Weight Bearing Restrictions: No    Mobility  Bed Mobility Overal bed mobility: Modified Independent Bed Mobility: Supine to Sit     Supine to sit: Modified independent (Device/Increase time)     General bed mobility comments: MOd-I for supine to sitting EOB.    Transfers Overall transfer level: Modified independent Equipment used: Rolling walker (2 wheeled) Transfers: Sit to/from Stand Sit to Stand: Modified  independent (Device/Increase time)            Ambulation/Gait Ambulation/Gait assistance: Modified independent (Device/Increase time) Gait Distance (Feet): 200 Feet Assistive device: Rolling walker (2 wheeled) Gait Pattern/deviations: Step-through pattern     General Gait Details: Slow cadence, no LOB noted. Stayed within RW.   Stairs             Wheelchair Mobility    Modified Rankin (Stroke Patients Only)       Balance Overall balance assessment: Needs assistance Sitting-balance support: Feet supported Sitting balance-Leahy Scale: Good     Standing balance support: Bilateral upper extremity supported;During functional activity Standing balance-Leahy Scale: Fair Standing balance comment: Upright walking with RW today. Able to perform toileting hygiene in standing with SUE on RW.                            Cognition Arousal/Alertness: Awake/alert Behavior During Therapy: WFL for tasks assessed/performed Overall Cognitive Status: Within Functional Limits for tasks assessed                                        Exercises Other Exercises Other Exercises: Therapeutic activity of changing soiled gown and brief, toileting in bathroom. Amb 200' with RW    General Comments        Pertinent Vitals/Pain Pain Assessment: Faces Faces Pain Scale: No hurt  Home Living                      Prior Function            PT Goals (current goals can now be found in the care plan section) Acute Rehab PT Goals Patient Stated Goal: To feel better and have a home PT Goal Formulation: With patient Time For Goal Achievement: 02/08/21 Potential to Achieve Goals: Good Progress towards PT goals: Progressing toward goals    Frequency    Min 2X/week      PT Plan Discharge plan needs to be updated    Co-evaluation              AM-PAC PT "6 Clicks" Mobility   Outcome Measure  Help needed turning from your back to  your side while in a flat bed without using bedrails?: A Little Help needed moving from lying on your back to sitting on the side of a flat bed without using bedrails?: A Little Help needed moving to and from a bed to a chair (including a wheelchair)?: A Little Help needed standing up from a chair using your arms (e.g., wheelchair or bedside chair)?: A Little Help needed to walk in hospital room?: A Little Help needed climbing 3-5 steps with a railing? : A Little 6 Click Score: 18    End of Session Equipment Utilized During Treatment: Gait belt Activity Tolerance: Patient tolerated treatment well Patient left: in bed;with call bell/phone within reach;with bed alarm set Nurse Communication: Mobility status PT Visit Diagnosis: History of falling (Z91.81);Muscle weakness (generalized) (M62.81);Difficulty in walking, not elsewhere classified (R26.2)     Time: 1497-0263 PT Time Calculation (min) (ACUTE ONLY): 30 min  Charges:  $Therapeutic Activity: 23-37 mins                    Irl Bodie M. Fairly IV, PT, DPT Physical Therapist- [place]  [facility]  02/01/2021, 12:14 PM

## 2021-02-01 NOTE — Progress Notes (Signed)
Patient discharged out of facility in stable condition. Patient verbalized understanding of discharge instructions. Patient taken to her car, parked in handicap parking spot in ER parking, via wheelchair. Pt ambulated with steady gait from wheelchair to trunk of car and then front seat of car a couple times to get different belongings. Pt then provided to lift dress and remove brief to apply her own pull up despite being told it was inappropriate to do so and there were people in the car beside her. Pt stated,  "I gotta do what I gotta do. I don't care."

## 2021-02-01 NOTE — TOC Transition Note (Signed)
Transition of Care [facility]) - CM/SW Discharge Note   Patient Details  Name: Alexandria Goodman MRN: [id number] Date of Birth: [birth year]  [facility] CM/SW Contact:  Allayne Butcher, RN Phone Number: 02/01/2021, 12:44 PM   Clinical Narrative:    Patient has greatly improved with her mobility over the weekend and yesterday.  Patient does not qualify for SNF, insurance will not authorize skilled nursing for rehab.  RNCM discussed this with patient and informed patient that she will be discharged today.  Patient voices okay and thank you.  Patient reports that she has a rollator that should be in her trunk and that her car has been moved to a handicap spot in the hospital parking lot by her daughter.  Patient will be working on trying to get back to [place] where she was living before she lived with her daughter.  Patient does have DSS case worker who is following her.   Patient does not qualify for home health as she has no address.    Final next level of care: Home/Self Care Barriers to Discharge: Barriers Resolved   Patient Goals and CMS Choice Patient states their goals for this hospitalization and ongoing recovery are:: Patient wants to get back close to [facility] where she was living before      Discharge Placement                       Discharge Plan and Services     Post Acute Care Choice: NA          DME Arranged: N/A DME Agency: NA       HH Arranged: NA          Social Determinants of Health (SDOH) Interventions     Readmission Risk Interventions No flowsheet data found.

## 2021-08-24 ENCOUNTER — Other Ambulatory Visit: Payer: Self-pay

## 2021-08-24 ENCOUNTER — Emergency Department
Admission: EM | Admit: 2021-08-24 | Discharge: 2021-08-24 | Disposition: A | Discharge status: Home / Self Care | Payer: Medicare HMO | Attending: Emergency Medicine | Admitting: Emergency Medicine

## 2021-08-24 ENCOUNTER — Emergency Department: Payer: Medicare HMO

## 2021-08-24 DIAGNOSIS — M791 Myalgia, unspecified site: Secondary | ICD-10-CM | POA: Insufficient documentation

## 2021-08-24 DIAGNOSIS — H6123 Impacted cerumen, bilateral: Secondary | ICD-10-CM | POA: Insufficient documentation

## 2021-08-24 DIAGNOSIS — Z20822 Contact with and (suspected) exposure to covid-19: Secondary | ICD-10-CM | POA: Insufficient documentation

## 2021-08-24 DIAGNOSIS — J029 Acute pharyngitis, unspecified: Secondary | ICD-10-CM | POA: Insufficient documentation

## 2021-08-24 DIAGNOSIS — J40 Bronchitis, not specified as acute or chronic: Secondary | ICD-10-CM | POA: Insufficient documentation

## 2021-08-24 DIAGNOSIS — R059 Cough, unspecified: Secondary | ICD-10-CM | POA: Diagnosis present

## 2021-08-24 LAB — RESP PANEL BY RT-PCR (FLU A&B, COVID) ARPGX2
Influenza A by PCR: NEGATIVE
Influenza B by PCR: NEGATIVE
SARS Coronavirus 2 by RT PCR: NEGATIVE

## 2021-08-24 MED ORDER — CARBAMIDE PEROXIDE 6.5 % OT SOLN
5.0000 [drp] | Freq: Two times a day (BID) | OTIC | 0 refills | Status: AC
Start: 2021-08-24 — End: 2021-08-28

## 2021-08-24 MED ORDER — ALUM & MAG HYDROXIDE-SIMETH 200-200-20 MG/5ML PO SUSP
5.0000 mL | Freq: Four times a day (QID) | ORAL | 0 refills | Status: AC | PRN
Start: 2021-08-24 — End: 2021-09-03

## 2021-08-24 MED ORDER — PREDNISONE 50 MG PO TABS: 50.0000 mg | ORAL_TABLET | Freq: Every day | ORAL | 0 refills | Status: AC

## 2021-08-24 NOTE — ED Provider Notes (Signed)
[facility] Provider Note    Event Date/Time   First MD Initiated Contact with Patient 08/24/21 1842     (approximate)   History   Chief Complaint No chief complaint on file.   HPI  Maleia Weems is a 86 y.o. female, history of atrial fibrillation, aortic stenosis, depression, obesity, and homelessness, presents to the emergency department for evaluation of flulike symptoms.  She endorses cough, chest congestion, body aches, sore throat, earache x 2 days.  Additionally endorses difficulty hearing.  Denies chest pain, shortness of breath, abdominal pain, back pain, headache, or urinary symptoms.  History Limitations: Patient is hard of hearing      Physical Exam  Triage Vital Signs: ED Triage Vitals [08/24/21 1759]  Enc Vitals Group     BP (!) 127/91     Pulse Rate 67     Resp 20     Temp 98.6 F (37 C)     Temp src      SpO2 94 %     Weight 185 lb (83.9 kg)     Height 4\' 6"  (1.372 m)     Head Circumference      Peak Flow      Pain Score 5     Pain Loc      Pain Edu?      Excl. in [place]?     Most recent vital signs: Vitals:   08/24/21 1759  BP: (!) 127/91  Pulse: 67  Resp: 20  Temp: 98.6 F (37 C)  SpO2: 94%     Physical Exam Constitutional:      General: She is not in acute distress.    Appearance: Normal appearance. She is not ill-appearing.  HENT:     Ears:     Comments: Cerumen impaction bilaterally.  Unable to visualize the TMs.    Mouth/Throat:     Mouth: Mucous membranes are moist.     Pharynx: Oropharynx is clear. No oropharyngeal exudate or posterior oropharyngeal erythema.  Eyes:     Conjunctiva/sclera: Conjunctivae normal.     Pupils: Pupils are equal, round, and reactive to light.  Pulmonary:     Effort: Pulmonary effort is normal. No respiratory distress.     Breath sounds: No wheezing, rhonchi or rales.  Chest:     Chest wall: No tenderness.  Abdominal:     General: Abdomen is flat.     Palpations:  Abdomen is soft.     Tenderness: There is no abdominal tenderness.  Musculoskeletal:     Cervical back: Normal range of motion and neck supple.  Skin:    General: Skin is warm and dry.     Capillary Refill: Capillary refill takes less than 2 seconds.  Neurological:     Mental Status: She is alert. Mental status is at baseline.      ED Results / Procedures / Treatments  Labs (all labs ordered are listed, but only abnormal results are displayed) Labs Reviewed  RESP PANEL BY RT-PCR (FLU A&B, COVID) ARPGX2     EKG Not applicable.   RADIOLOGY I personally viewed and evaluated these images as part of my medical decision making, as well as reviewing the written report by the radiologist.  ED Provider Interpretation: I agree with the interpretation of the radiologist.  No signs of pulmonary edema or focal pulmonary consolidation.  DG Chest 2 View  Result Date: 08/24/2021 CLINICAL DATA:  Cough, fever EXAM: CHEST - 2 VIEW COMPARISON:  01/25/2021 FINDINGS:  Transverse diameter of heart is increased. There are no signs of pulmonary edema or focal pulmonary consolidation. There is decrease in pulmonary vascular congestion in comparison with the previous study. There is prosthetic valve/stent in the region of aortic valve. Surgical clips are seen in the right upper quadrant of abdomen. Degenerative changes are noted in both shoulders, more so on the right side. IMPRESSION: Cardiomegaly. There are no signs of pulmonary edema or focal pulmonary consolidation. There is interval placement of prosthetic valve/stent in the region of aortic valve. Electronically Signed   By: Ernie Avena M.D.   On: 08/24/2021 18:28    PROCEDURES:  Critical Care performed: None.  Procedures    MEDICATIONS ORDERED IN ED: Medications - No data to display   IMPRESSION / MDM / ASSESSMENT AND PLAN / ED COURSE  I reviewed the triage vital signs and the nursing notes.                              Makala Fetterolf is a 86 y.o. female, history of atrial fibrillation, aortic stenosis, depression, obesity, and homelessness, presents the emergency department for evaluation of flulike symptoms.  She endorses cough, chest congestion, body aches, sore throat, earache x 2 days.  Additionally endorses difficulty hearing.   Differential diagnosis includes, but is not limited to, influenza/COVID-19, bronchitis, pneumonia, strep throat, viral URI, otitis media/externa  Patient appears well.  She is sitting upright comfortably in her chair.  NAD.  Physical exam overall unremarkable.  Lung sounds are clear bilaterally.  She does notably have impacted cerumen in both ears.  Unable to visualize the TMs.  We will go ahead and provide cleaning over ears.  Respiratory panel negative for influenza or COVID-19.  Chest x-ray is unremarkable.  I suspect that the patient is likely experiencing a viral bronchitis.  I do not suspect any serious complications.  Given the patient's endorsement of chest tightness and congestion, will provide a prescription for prednisone.  Patient additionally states that she has soreness along her gums due to her dentures, as well as a sore throat.  She is requesting a prescription for Magic mouthwash.  We will go ahead and prescribe as requested.  She was also provided a prescription for Debrox.  Patient was discharged with anticipatory guidance, return precautions, and educational material. Encouraged the patient to return to the emergency department at any time if they begin to experience any new or worsening symptoms.       FINAL CLINICAL IMPRESSION(S) / ED DIAGNOSES   Final diagnoses:  Bronchitis  Bilateral impacted cerumen     Rx / DC Orders   ED Discharge Orders          Ordered    predniSONE (DELTASONE) 50 MG tablet  Daily with breakfast        08/24/21 2044    carbamide peroxide (DEBROX) 6.5 % OTIC solution  2 times daily        08/24/21 2044    magic mouthwash (nystatin,  lidocaine, diphenhydrAMINE, alum & mag hydroxide) suspension  4 times daily PRN        08/24/21 2044             Note:  This document was prepared using Dragon voice recognition software and may include unintentional dictation errors.   Varney Daily, [place] 08/24/21 2051    Georga Hacking, MD 08/25/21 [phone]

## 2021-08-24 NOTE — ED Triage Notes (Signed)
Pt to ED for cough, body aches , sorethroat, ear ache or the past couple days.  NAD noted. States she is just thirsty  Denies shob at this time RR Even and unlabored, speaking in complete sentences.

## 2021-08-24 NOTE — ED Triage Notes (Signed)
Pt comes into the ED via [facility] from home c/o flu like symptoms including cough, fever, and chills.  Pt does admit to some SHOB.  144/75 82 HR 97% RA 153 CBG 99.1 oral A&Ox4.

## 2021-08-24 NOTE — Discharge Instructions (Addendum)
-  Please return to the emergency department anytime if you begin to experience any new or worsening symptoms. -Use Debrox periodically to aid with clearing out earwax. -Use magic mouthwash as needed for mouth pain

## 2021-09-28 ENCOUNTER — Observation Stay: Payer: Medicare HMO

## 2021-09-28 ENCOUNTER — Emergency Department: Payer: Medicare HMO

## 2021-09-28 ENCOUNTER — Other Ambulatory Visit: Payer: Self-pay

## 2021-09-28 ENCOUNTER — Inpatient Hospital Stay
Admission: EM | Admit: 2021-09-28 | Discharge: 2021-09-30 | DRG: 392 | Disposition: A | Discharge status: Home / Self Care | Payer: Medicare HMO | Attending: Internal Medicine | Admitting: Internal Medicine

## 2021-09-28 DIAGNOSIS — Z794 Long term (current) use of insulin: Secondary | ICD-10-CM | POA: Diagnosis not present

## 2021-09-28 DIAGNOSIS — F32A Depression, unspecified: Secondary | ICD-10-CM | POA: Diagnosis present

## 2021-09-28 DIAGNOSIS — I350 Nonrheumatic aortic (valve) stenosis: Secondary | ICD-10-CM | POA: Diagnosis present

## 2021-09-28 DIAGNOSIS — Z7901 Long term (current) use of anticoagulants: Secondary | ICD-10-CM

## 2021-09-28 DIAGNOSIS — I480 Paroxysmal atrial fibrillation: Secondary | ICD-10-CM | POA: Diagnosis present

## 2021-09-28 DIAGNOSIS — Z888 Allergy status to other drugs, medicaments and biological substances status: Secondary | ICD-10-CM

## 2021-09-28 DIAGNOSIS — N390 Urinary tract infection, site not specified: Secondary | ICD-10-CM | POA: Diagnosis present

## 2021-09-28 DIAGNOSIS — I5032 Chronic diastolic (congestive) heart failure: Secondary | ICD-10-CM | POA: Diagnosis present

## 2021-09-28 DIAGNOSIS — Z79899 Other long term (current) drug therapy: Secondary | ICD-10-CM

## 2021-09-28 DIAGNOSIS — K529 Noninfective gastroenteritis and colitis, unspecified: Secondary | ICD-10-CM | POA: Diagnosis not present

## 2021-09-28 DIAGNOSIS — N200 Calculus of kidney: Secondary | ICD-10-CM | POA: Diagnosis present

## 2021-09-28 DIAGNOSIS — R1084 Generalized abdominal pain: Secondary | ICD-10-CM

## 2021-09-28 DIAGNOSIS — I110 Hypertensive heart disease with heart failure: Secondary | ICD-10-CM | POA: Diagnosis present

## 2021-09-28 DIAGNOSIS — R531 Weakness: Secondary | ICD-10-CM

## 2021-09-28 DIAGNOSIS — R109 Unspecified abdominal pain: Secondary | ICD-10-CM | POA: Diagnosis present

## 2021-09-28 DIAGNOSIS — E119 Type 2 diabetes mellitus without complications: Secondary | ICD-10-CM | POA: Diagnosis not present

## 2021-09-28 DIAGNOSIS — Z6841 Body Mass Index (BMI) 40.0 and over, adult: Secondary | ICD-10-CM

## 2021-09-28 DIAGNOSIS — Z20822 Contact with and (suspected) exposure to covid-19: Secondary | ICD-10-CM | POA: Diagnosis present

## 2021-09-28 DIAGNOSIS — E860 Dehydration: Secondary | ICD-10-CM | POA: Diagnosis present

## 2021-09-28 DIAGNOSIS — I10 Essential (primary) hypertension: Secondary | ICD-10-CM | POA: Diagnosis present

## 2021-09-28 DIAGNOSIS — K629 Disease of anus and rectum, unspecified: Secondary | ICD-10-CM | POA: Diagnosis present

## 2021-09-28 DIAGNOSIS — K6289 Other specified diseases of anus and rectum: Secondary | ICD-10-CM | POA: Diagnosis present

## 2021-09-28 DIAGNOSIS — Z882 Allergy status to sulfonamides status: Secondary | ICD-10-CM

## 2021-09-28 DIAGNOSIS — Z885 Allergy status to narcotic agent status: Secondary | ICD-10-CM

## 2021-09-28 LAB — HEMOGLOBIN A1C
Hgb A1c MFr Bld: 5.9 % — ABNORMAL HIGH (ref 4.8–5.6)
Mean Plasma Glucose: 122.63 mg/dL

## 2021-09-28 LAB — CBC
HCT: 44.5 % (ref 36.0–46.0)
Hemoglobin: 14.1 g/dL (ref 12.0–15.0)
MCH: 30.3 pg (ref 26.0–34.0)
MCHC: 31.7 g/dL (ref 30.0–36.0)
MCV: 95.7 fL (ref 80.0–100.0)
Platelets: 124 10*3/uL — ABNORMAL LOW (ref 150–400)
RBC: 4.65 MIL/uL (ref 3.87–5.11)
RDW: 12.6 % (ref 11.5–15.5)
WBC: 11.5 10*3/uL — ABNORMAL HIGH (ref 4.0–10.5)
nRBC: 0 % (ref 0.0–0.2)

## 2021-09-28 LAB — GLUCOSE, CAPILLARY: Glucose-Capillary: 100 mg/dL — ABNORMAL HIGH (ref 70–99)

## 2021-09-28 LAB — RESP PANEL BY RT-PCR (FLU A&B, COVID) ARPGX2
Influenza A by PCR: NEGATIVE
Influenza B by PCR: NEGATIVE
SARS Coronavirus 2 by RT PCR: NEGATIVE

## 2021-09-28 LAB — COMPREHENSIVE METABOLIC PANEL
ALT: 55 U/L — ABNORMAL HIGH (ref 0–44)
AST: 60 U/L — ABNORMAL HIGH (ref 15–41)
Albumin: 3.4 g/dL — ABNORMAL LOW (ref 3.5–5.0)
Alkaline Phosphatase: 216 U/L — ABNORMAL HIGH (ref 38–126)
Anion gap: 6 (ref 5–15)
BUN: 23 mg/dL (ref 8–23)
CO2: 28 mmol/L (ref 22–32)
Calcium: 9.3 mg/dL (ref 8.9–10.3)
Chloride: 104 mmol/L (ref 98–111)
Creatinine, Ser: 0.75 mg/dL (ref 0.44–1.00)
GFR, Estimated: >60 mL/min (ref >60)
Glucose, Bld: 154 mg/dL — ABNORMAL HIGH (ref 70–99)
Potassium: 4.4 mmol/L (ref 3.5–5.1)
Sodium: 138 mmol/L (ref 135–145)
Total Bilirubin: 0.9 mg/dL (ref 0.3–1.2)
Total Protein: 7.6 g/dL (ref 6.5–8.1)

## 2021-09-28 LAB — LIPASE, BLOOD: Lipase: 239 U/L — ABNORMAL HIGH (ref 11–51)

## 2021-09-28 LAB — CBG MONITORING, ED: Glucose-Capillary: 167 mg/dL — ABNORMAL HIGH (ref 70–99)

## 2021-09-28 MED ORDER — BUMETANIDE 1 MG PO TABS
2.0000 mg | ORAL_TABLET | Freq: Every day | ORAL | Status: DC
  Filled 2021-09-28: qty 2

## 2021-09-28 MED ORDER — ACETAMINOPHEN 650 MG RE SUPP: 650.0000 mg | Freq: Four times a day (QID) | RECTAL | Status: DC | PRN

## 2021-09-28 MED ORDER — OXYCODONE-ACETAMINOPHEN 10-325 MG PO TABS: 1.0000 | ORAL_TABLET | Freq: Three times a day (TID) | ORAL | Status: DC | PRN

## 2021-09-28 MED ORDER — INSULIN ASPART 100 UNIT/ML IJ SOLN
0.0000 [IU] | Freq: Three times a day (TID) | INTRAMUSCULAR | Status: DC
  Administered 2021-09-28: 3 [IU] via SUBCUTANEOUS
  Filled 2021-09-28: qty 1

## 2021-09-28 MED ORDER — ACETAMINOPHEN 325 MG PO TABS
650.0000 mg | ORAL_TABLET | Freq: Four times a day (QID) | ORAL | Status: DC | PRN
  Administered 2021-09-29: 650 mg via ORAL
  Filled 2021-09-28: qty 2

## 2021-09-28 MED ORDER — CARVEDILOL 6.25 MG PO TABS
6.2500 mg | ORAL_TABLET | Freq: Every day | ORAL | Status: DC
  Administered 2021-09-28 – 2021-09-30 (×3): 6.25 mg via ORAL
  Filled 2021-09-28 (×3): qty 1

## 2021-09-28 MED ORDER — VITAMIN D 25 MCG (1000 UNIT) PO TABS
4000.0000 [IU] | ORAL_TABLET | Freq: Every day | ORAL | Status: DC
  Administered 2021-09-28 – 2021-09-30 (×3): 4000 [IU] via ORAL
  Filled 2021-09-28 (×3): qty 4

## 2021-09-28 MED ORDER — LOSARTAN POTASSIUM 50 MG PO TABS
100.0000 mg | ORAL_TABLET | Freq: Every day | ORAL | Status: DC
  Administered 2021-09-28 – 2021-09-30 (×3): 100 mg via ORAL
  Filled 2021-09-28 (×3): qty 2

## 2021-09-28 MED ORDER — ONDANSETRON HCL 4 MG/2ML IJ SOLN: 4.0000 mg | Freq: Four times a day (QID) | INTRAMUSCULAR | Status: DC | PRN

## 2021-09-28 MED ORDER — DULOXETINE HCL 30 MG PO CPEP
60.0000 mg | ORAL_CAPSULE | Freq: Every day | ORAL | Status: DC
  Administered 2021-09-28 – 2021-09-30 (×3): 60 mg via ORAL
  Filled 2021-09-28: qty 2
  Filled 2021-09-28: qty 1
  Filled 2021-09-28: qty 2

## 2021-09-28 MED ORDER — IOHEXOL 300 MG/ML  SOLN
100.0000 mL | Freq: Once | INTRAMUSCULAR | Status: AC | PRN
  Administered 2021-09-28: 100 mL via INTRAVENOUS

## 2021-09-28 MED ORDER — SODIUM CHLORIDE 0.9 % IV SOLN: INTRAVENOUS | Status: AC

## 2021-09-28 MED ORDER — OXYCODONE HCL 5 MG PO TABS
5.0000 mg | ORAL_TABLET | Freq: Three times a day (TID) | ORAL | Status: DC | PRN
  Administered 2021-09-29 (×2): 5 mg via ORAL
  Filled 2021-09-28 (×2): qty 1

## 2021-09-28 MED ORDER — ONDANSETRON HCL 4 MG PO TABS: 4.0000 mg | ORAL_TABLET | Freq: Four times a day (QID) | ORAL | Status: DC | PRN

## 2021-09-28 MED ORDER — ADULT MULTIVITAMIN W/MINERALS CH
1.0000 | ORAL_TABLET | Freq: Every day | ORAL | Status: DC
  Administered 2021-09-28 – 2021-09-30 (×3): 1 via ORAL
  Filled 2021-09-28 (×3): qty 1

## 2021-09-28 MED ORDER — APIXABAN 5 MG PO TABS
5.0000 mg | ORAL_TABLET | Freq: Two times a day (BID) | ORAL | Status: DC
  Administered 2021-09-28: 5 mg via ORAL
  Filled 2021-09-28: qty 1

## 2021-09-28 MED ORDER — SODIUM CHLORIDE 0.9 % IV BOLUS
500.0000 mL | Freq: Once | INTRAVENOUS | Status: AC
  Administered 2021-09-28: 500 mL via INTRAVENOUS

## 2021-09-28 MED ORDER — ONDANSETRON HCL 4 MG/2ML IJ SOLN
4.0000 mg | Freq: Once | INTRAMUSCULAR | Status: AC
  Administered 2021-09-28: 4 mg via INTRAVENOUS
  Filled 2021-09-28: qty 2

## 2021-09-28 MED ORDER — OXYCODONE-ACETAMINOPHEN 5-325 MG PO TABS: 1.0000 | ORAL_TABLET | Freq: Three times a day (TID) | ORAL | Status: DC | PRN

## 2021-09-28 NOTE — ED Notes (Signed)
Patient brief changed. Gave patient CHG wipe down. New brief placed.  Patient stated that her IV was hurting her unbearably, this nurse pulled IV with tip intact. No other abnormalities noted

## 2021-09-28 NOTE — ED Notes (Signed)
Attempted IV, patient tolerated well. Refuses to be stuck again at this time

## 2021-09-28 NOTE — ED Provider Notes (Signed)
[facility] Provider Note    Event Date/Time   First MD Initiated Contact with Patient 09/28/21 1113     (approximate)   History   Abdominal Pain   HPI  Topaz Raglin is a 86 y.o. female with history of insulin-dependent diabetes, hypertension, CHF, and dysrhythmias presents emergency department with vomiting and diarrhea.  Symptoms started at 4 AM this morning.  Patient states that multiple family members have had the same.  Has been intermittent over the past week.  Feels that it is more of an infection versus food poisoning.  Patient states she has also had a cough since December.  States that had started to get better but now with the vomiting she feels like she is getting worse.  No dysuria.  Some epigastric pain.  No chest pain/shortness of breath      Physical Exam   Triage Vital Signs: ED Triage Vitals [09/28/21 1030]  Enc Vitals Group     BP (!) 181/74     Pulse Rate 84     Resp 20     Temp 98.6 F (37 C)     Temp Source Oral     SpO2 94 %     Weight 170 lb (77.1 kg)     Height _0  (1.372 m)     Head Circumference      Peak Flow      Pain Score      Pain Loc      Pain Edu?      Excl. in [place]?     Most recent vital signs: Vitals:   09/28/21 1230 09/28/21 1300  BP: (!) 141/67 (!) 118/47  Pulse: 94 97  Resp: 19 16  Temp:    SpO2: 94% 90%     General: Awake, no distress.   CV:  Good peripheral perfusion. regular rate and  rhythm Resp:  Normal effort. Lungs CTA, cough is wet Abd:  No distention.  Tender in epigastric only, bowel sounds normal Other:  No pedal edema noted   ED Results / Procedures / Treatments   Labs (all labs ordered are listed, but only abnormal results are displayed) Labs Reviewed  LIPASE, BLOOD - Abnormal; Notable for the following components:      Result Value   Lipase 239 (*)    All other components within normal limits  COMPREHENSIVE METABOLIC PANEL - Abnormal; Notable for the following  components:   Glucose, Bld 154 (*)    Albumin 3.4 (*)    AST 60 (*)    ALT 55 (*)    Alkaline Phosphatase 216 (*)    All other components within normal limits  CBC - Abnormal; Notable for the following components:   WBC 11.5 (*)    Platelets 124 (*)    All other components within normal limits  RESP PANEL BY RT-PCR (FLU A&B, COVID) ARPGX2  GASTROINTESTINAL PANEL BY PCR, STOOL (REPLACES STOOL CULTURE)  URINALYSIS, ROUTINE W REFLEX MICROSCOPIC     EKG  EKG normal sinus rhythm, heart rate 86, see physician read   RADIOLOGY Chest x-ray    PROCEDURES:   Procedures   MEDICATIONS ORDERED IN ED: Medications  sodium chloride 0.9 % bolus 500 mL (0 mLs Intravenous Stopped 09/28/21 1230)  ondansetron (ZOFRAN) injection 4 mg (4 mg Intravenous Given 09/28/21 1143)  iohexol (OMNIPAQUE) 300 MG/ML solution 100 mL (100 mLs Intravenous Contrast Given 09/28/21 1213)     IMPRESSION / MDM / ASSESSMENT AND PLAN /  ED COURSE  I reviewed the triage vital signs and the nursing notes.                              Differential diagnosis includes, but is not limited to, pancreatitis, gastroenteritis, DKA, COVID  Patient's labs have a elevated WBC of 11.5, lipase is 239 which is elevated, comprehensive metabolic panel also has elevated liver enzymes of AST 60, ALT of 55, and alk phos of 216.  Patient's glucose is elevated but she is an insulin-dependent diabetic, measuring at 154 on her lab sample today.  Patient's anion gap is normal so do not feel patient is in DKA at this time.  Due to the elevated lipase and liver enzymes along with vomiting and diarrhea will order CT abdomen/pelvis with IV contrast  Patient was given normal saline 500 mL, Zofran 4 mg IV.  Did not want to give her a whole liter as she has a history of CHF.  Chest x-ray for the wet cough.  Chest x-ray shows possible pneumonia or atelectasis, this was independently reviewed by me and confirmed by radiology  CT  abdomen/pelvis was reviewed by me.  Awaiting radiology read. Radiologist comments most likely enteritis no bowel obstruction, does see a possible mass in the rectum.  Did explain findings to the patient.  Will be admitting for weakness and enteritis.  Consult hospitalist  Dr Francine Graven will be admitting, pt is in stable condition       FINAL CLINICAL IMPRESSION(S) / ED DIAGNOSES   Final diagnoses:  Weakness  Enteritis     Rx / DC Orders   ED Discharge Orders     None        Note:  This document was prepared using Dragon voice recognition software and may include unintentional dictation errors.    Versie Starks, PA-C 09/28/21 1338    Duffy Bruce, MD 09/29/21 [phone]

## 2021-09-28 NOTE — ED Triage Notes (Signed)
Pt comes into the ED via EMS from home with c/o generalized abd pain with N/V/D since 4am, states several other family in the house hold have same sx  130/88 96%RA 84HR [id number]

## 2021-09-28 NOTE — ED Notes (Signed)
Pt unable to urinate at this time.  

## 2021-09-28 NOTE — H&P (Addendum)
History and Physical    Patient: Alexandria Goodman PQZ:[id number] DOB: 1936/07/08 DOA: 09/28/2021 DOS: the patient was seen and examined on 09/28/2021 PCP: System, Provider Not In  Patient coming from: Home  Chief Complaint:  Chief Complaint  Patient presents with   Abdominal Pain    HPI: Alexandria Goodman is a 86 y.o. female with medical history significant for morbid obesity, hypertension, chronic diastolic dysfunction CHF, aortic valve stenosis, diabetes mellitus who presents to the ER via EMS for evaluation of nausea, vomiting and diarrhea which started suddenly at about 4 AM associated with generalized abdominal pain. Patient states that all her family members have been sick with this virus and she developed symptoms on the morning of her admission. She did have diffuse abdominal pain which at the time of my exam has resolved.  She denies having any hematochezia or passage of melena stools, no fever, no chills, no leg swelling, no headache, no urinary symptoms, no dizziness, no lightheadedness.  Review of Systems: As mentioned in the history of present illness. All other systems reviewed and are negative. Past Medical History:  Diagnosis Date   CHF (congestive heart failure) (HCC)    Depression    Dysrhythmia    Hypertension    History reviewed. No pertinent surgical history. Social History:  reports that she has never smoked. She does not have any smokeless tobacco history on file. She reports that she does not drink alcohol and does not use drugs.  Allergies  Allergen Reactions   Alendronate Other (See Comments)    Hand Pain Other reaction(s): Other Hand pain  Hand pain  Hand Pain Other reaction(s): Other Hand pain     Dilaudid [Hydromorphone] Rash   Monosodium Glutamate Anaphylaxis   Alendronate Sodium    Levofloxacin Hives   Lisinopril Cough   Morphine And Related Hives   Pravastatin Itching   Solifenacin Other (See Comments)    unknown   Tradjenta [Linagliptin]      unknown   Trospium     unknown   Augmentin [Amoxicillin-Pot Clavulanate]     Yeast infection   Sulfa Antibiotics Rash    Family History  Family history unknown: Yes    Prior to Admission medications   Medication Sig Start Date End Date Taking? Authorizing Provider  apixaban (ELIQUIS) 5 MG TABS tablet Take 5 mg by mouth 2 (two) times daily. 02/20/20  Yes [provider]  bumetanide (BUMEX) 2 MG tablet 2 mg 2 (two) times daily. 02/20/20  Yes [provider]  carvedilol (COREG) 6.25 MG tablet Take 6.25 mg by mouth daily. 02/20/20 09/28/21 Yes [provider]  Cholecalciferol 50 MCG (2000 UT) CAPS Take 4,000 Units by mouth daily.   Yes [provider]  docusate sodium (COLACE) 100 MG capsule Take 300 mg by mouth at bedtime.   Yes [provider]  DULoxetine (CYMBALTA) 60 MG capsule Take 60 mg by mouth daily. 07/01/18  Yes [provider]  insulin NPH-regular Human (70-30) 100 UNIT/ML injection Inject 5-10 Units into the skin 3 (three) times daily. 08/12/21 08/12/22 Yes [provider]  losartan (COZAAR) 50 MG tablet Take 100 mg by mouth daily. 08/26/20  Yes [provider]  Multiple Vitamin (MULTI-VITAMIN) tablet Take 1 tablet by mouth daily.   Yes [provider]  oxyCODONE-acetaminophen (PERCOCET) 10-325 MG tablet Take 1 tablet by mouth 3 (three) times daily as needed. 10/09/20  Yes [provider]  bisacodyl (DULCOLAX) 5 MG EC tablet Take 5 mg by mouth daily  as needed.    [provider]  BRILINTA 90 MG TABS tablet Take 90 mg by mouth 2 (two) times daily. Patient not taking: Reported on 09/28/2021 08/01/21   [provider]  diclofenac Sodium (VOLTAREN) 1 % GEL Apply 2 g topically 4 (four) times daily.    [provider]    Physical Exam: Vitals:   09/28/21 1030 09/28/21 1230 09/28/21 1300  BP: (!) 181/74 (!) 141/67 (!) 118/47  Pulse: 84 94 97  Resp: 20 19 16   Temp: 98.6 F  (37 C)    TempSrc: Oral    SpO2: 94% 94% 90%  Weight: 77.1 kg    Height: 4\' 6"  (1.372 m)    Physical Exam Vitals and nursing note reviewed.  Constitutional:      Appearance: She is obese.  HENT:     Head: Normocephalic and atraumatic.     Mouth/Throat:     Mouth: Mucous membranes are moist.  Cardiovascular:     Rate and Rhythm: Normal rate and regular rhythm.  Pulmonary:     Effort: Pulmonary effort is normal.     Breath sounds: Normal breath sounds.  Abdominal:     General: Abdomen is flat. Bowel sounds are normal.     Palpations: Abdomen is soft.     Tenderness: There is generalized abdominal tenderness.  Skin:    General: Skin is warm.  Neurological:     General: No focal deficit present.     Mental Status: She is alert.  Psychiatric:        Mood and Affect: Mood normal.        Behavior: Behavior normal.     Data Reviewed: Notes from primary care and specialist visits, past discharge summaries. Prior diagnostic testing as applicable to current admission diagnoses Updated medications and problem lists for reconciliation ED course, including vitals, labs, imaging, treatment and response to treatment Triage notes and ED providers notes Lipase 239, liver enzymes elevated CT scan of abdomen and pelvis shows scattered air-fluid levels in nondilated loops of proximal jejunum, nonspecific and not necessarily abnormal, but conceivably subtle manifestation of mild proximal enteritis.  Accentuated density in the rectum could be from local wall thickening or rectal contents.  Strictly speaking a rectal mass is not excluded.  Chronic scarring in the right middle lobe with a nodular component.  There are no new results to review at this time.  Assessment and plan  Principal Problem:   Abdominal pain Active Problems:   AF (paroxysmal atrial fibrillation) (HCC)   Essential hypertension   Obesity, Class III, BMI 40-49.9 (morbid obesity) (HCC)   Aortic stenosis   Diabetes  mellitus (HCC)    Abdominal pain/acute gastroenteritis ??  Viral etiology Multiple sick contacts Supportive care with antiemetics, judicious IV fluid hydration Place patient on clear liquid diet and advance as tolerated Stool PCR GI consult for evaluation for possible colonoscopy to rule out the rectal mass noted on imaging.    Diabetes mellitus Glycemic control with sliding scale insulin    Chronic diastolic dysfunction CHF Stable and not acutely exacerbated Continue carvedilol, Bumex and losartan    History of paroxysmal A-fib Continue carvedilol for rate control Hold Eliquis which patient is on as primary prophylaxis for an acute stroke for possible colonoscopy    Morbid obesity (BMI 40) Complicates overall prognosis and care   Advance Care Planning:   Code Status: Full Code   Consults: Gastroenterology  Family Communication: Greater than 50% of time was spent discussing  patient's condition and plan of care at the bedside.  All questions and concerns have been addressed  Severity of Illness: The appropriate patient status for this patient is OBSERVATION. Observation status is judged to be reasonable and necessary in order to provide the required intensity of service to ensure the patient's safety. The patient's presenting symptoms, physical exam findings, and initial radiographic and laboratory data in the context of their medical condition is felt to place them at decreased risk for further clinical deterioration. Furthermore, it is anticipated that the patient will be medically stable for discharge from the hospital within 2 midnights of admission.   Author: Lucile Shutters, MD 09/28/2021 2:33 PM  For on call review [email or web address].

## 2021-09-29 DIAGNOSIS — N200 Calculus of kidney: Secondary | ICD-10-CM | POA: Diagnosis present

## 2021-09-29 DIAGNOSIS — K6289 Other specified diseases of anus and rectum: Secondary | ICD-10-CM | POA: Diagnosis present

## 2021-09-29 DIAGNOSIS — I5032 Chronic diastolic (congestive) heart failure: Secondary | ICD-10-CM

## 2021-09-29 DIAGNOSIS — N390 Urinary tract infection, site not specified: Secondary | ICD-10-CM

## 2021-09-29 DIAGNOSIS — Z888 Allergy status to other drugs, medicaments and biological substances status: Secondary | ICD-10-CM | POA: Diagnosis not present

## 2021-09-29 DIAGNOSIS — E860 Dehydration: Secondary | ICD-10-CM | POA: Diagnosis present

## 2021-09-29 DIAGNOSIS — R9389 Abnormal findings on diagnostic imaging of other specified body structures: Secondary | ICD-10-CM | POA: Diagnosis not present

## 2021-09-29 DIAGNOSIS — Z7901 Long term (current) use of anticoagulants: Secondary | ICD-10-CM | POA: Diagnosis not present

## 2021-09-29 DIAGNOSIS — Z20822 Contact with and (suspected) exposure to covid-19: Secondary | ICD-10-CM | POA: Diagnosis present

## 2021-09-29 DIAGNOSIS — K529 Noninfective gastroenteritis and colitis, unspecified: Principal | ICD-10-CM

## 2021-09-29 DIAGNOSIS — Z882 Allergy status to sulfonamides status: Secondary | ICD-10-CM | POA: Diagnosis not present

## 2021-09-29 DIAGNOSIS — Z79899 Other long term (current) drug therapy: Secondary | ICD-10-CM | POA: Diagnosis not present

## 2021-09-29 DIAGNOSIS — Z885 Allergy status to narcotic agent status: Secondary | ICD-10-CM | POA: Diagnosis not present

## 2021-09-29 DIAGNOSIS — F32A Depression, unspecified: Secondary | ICD-10-CM | POA: Diagnosis present

## 2021-09-29 DIAGNOSIS — Z6841 Body Mass Index (BMI) 40.0 and over, adult: Secondary | ICD-10-CM | POA: Diagnosis not present

## 2021-09-29 DIAGNOSIS — K629 Disease of anus and rectum, unspecified: Secondary | ICD-10-CM | POA: Diagnosis present

## 2021-09-29 DIAGNOSIS — I110 Hypertensive heart disease with heart failure: Secondary | ICD-10-CM | POA: Diagnosis present

## 2021-09-29 DIAGNOSIS — I350 Nonrheumatic aortic (valve) stenosis: Secondary | ICD-10-CM | POA: Diagnosis present

## 2021-09-29 DIAGNOSIS — E119 Type 2 diabetes mellitus without complications: Secondary | ICD-10-CM | POA: Diagnosis present

## 2021-09-29 DIAGNOSIS — I480 Paroxysmal atrial fibrillation: Secondary | ICD-10-CM | POA: Diagnosis present

## 2021-09-29 DIAGNOSIS — Z794 Long term (current) use of insulin: Secondary | ICD-10-CM | POA: Diagnosis not present

## 2021-09-29 LAB — GLUCOSE, CAPILLARY
Glucose-Capillary: 91 mg/dL (ref 70–99)
Glucose-Capillary: 143 mg/dL — ABNORMAL HIGH (ref 70–99)
Glucose-Capillary: 143 mg/dL — ABNORMAL HIGH (ref 70–99)
Glucose-Capillary: 106 mg/dL — ABNORMAL HIGH (ref 70–99)

## 2021-09-29 LAB — URINALYSIS, ROUTINE W REFLEX MICROSCOPIC
Bilirubin Urine: NEGATIVE
Glucose, UA: NEGATIVE mg/dL
Hgb urine dipstick: NEGATIVE
Ketones, ur: NEGATIVE mg/dL
Nitrite: POSITIVE — AB
Protein, ur: NEGATIVE mg/dL
Specific Gravity, Urine: 1.018 (ref 1.005–1.030)
pH: 5 (ref 5.0–8.0)

## 2021-09-29 LAB — CBC
HCT: 36.5 % (ref 36.0–46.0)
Hemoglobin: 11.7 g/dL — ABNORMAL LOW (ref 12.0–15.0)
MCH: 30.6 pg (ref 26.0–34.0)
MCHC: 32.1 g/dL (ref 30.0–36.0)
MCV: 95.5 fL (ref 80.0–100.0)
Platelets: 109 10*3/uL — ABNORMAL LOW (ref 150–400)
RBC: 3.82 MIL/uL — ABNORMAL LOW (ref 3.87–5.11)
RDW: 13.2 % (ref 11.5–15.5)
WBC: 7.1 10*3/uL (ref 4.0–10.5)
nRBC: 0 % (ref 0.0–0.2)

## 2021-09-29 LAB — HEPATIC FUNCTION PANEL
ALT: 36 U/L (ref 0–44)
AST: 36 U/L (ref 15–41)
Albumin: 2.7 g/dL — ABNORMAL LOW (ref 3.5–5.0)
Alkaline Phosphatase: 154 U/L — ABNORMAL HIGH (ref 38–126)
Bilirubin, Direct: 0.2 mg/dL (ref 0.0–0.2)
Indirect Bilirubin: 0.7 mg/dL (ref 0.3–0.9)
Total Bilirubin: 0.9 mg/dL (ref 0.3–1.2)
Total Protein: 6.1 g/dL — ABNORMAL LOW (ref 6.5–8.1)

## 2021-09-29 LAB — HEPATITIS PANEL, ACUTE
HCV Ab: NONREACTIVE
Hep A IgM: NONREACTIVE
Hep B C IgM: NONREACTIVE
Hepatitis B Surface Ag: NONREACTIVE

## 2021-09-29 LAB — BASIC METABOLIC PANEL
Anion gap: 3 — ABNORMAL LOW (ref 5–15)
BUN: 23 mg/dL (ref 8–23)
CO2: 28 mmol/L (ref 22–32)
Calcium: 8.3 mg/dL — ABNORMAL LOW (ref 8.9–10.3)
Chloride: 104 mmol/L (ref 98–111)
Creatinine, Ser: 0.83 mg/dL (ref 0.44–1.00)
GFR, Estimated: >60 mL/min (ref >60)
Glucose, Bld: 116 mg/dL — ABNORMAL HIGH (ref 70–99)
Potassium: 3.8 mmol/L (ref 3.5–5.1)
Sodium: 135 mmol/L (ref 135–145)

## 2021-09-29 LAB — LIPASE, BLOOD: Lipase: 51 U/L (ref 11–51)

## 2021-09-29 MED ORDER — SODIUM CHLORIDE 0.9 % IV SOLN
1.0000 g | Freq: Every day | INTRAVENOUS | Status: DC
  Administered 2021-09-29 – 2021-09-30 (×2): 1 g via INTRAVENOUS
  Filled 2021-09-29: qty 10
  Filled 2021-09-29: qty 1

## 2021-09-29 MED ORDER — DEXTROSE-NACL 5-0.45 % IV SOLN: INTRAVENOUS | Status: AC

## 2021-09-29 MED ORDER — INSULIN ASPART 100 UNIT/ML IJ SOLN
0.0000 [IU] | Freq: Three times a day (TID) | INTRAMUSCULAR | Status: DC
  Administered 2021-09-29 (×2): 2 [IU] via SUBCUTANEOUS
  Filled 2021-09-29 (×2): qty 1

## 2021-09-29 MED ORDER — GUAIFENESIN 100 MG/5ML PO LIQD: 5.0000 mL | ORAL | Status: DC | PRN

## 2021-09-29 MED ORDER — METOPROLOL TARTRATE 5 MG/5ML IV SOLN: 5.0000 mg | INTRAVENOUS | Status: DC | PRN

## 2021-09-29 MED ORDER — TRAZODONE HCL 50 MG PO TABS
50.0000 mg | ORAL_TABLET | Freq: Every evening | ORAL | Status: DC | PRN
  Administered 2021-09-29: 50 mg via ORAL
  Filled 2021-09-29: qty 1

## 2021-09-29 MED ORDER — SODIUM CHLORIDE 0.9 % IV SOLN: INTRAVENOUS | Status: DC

## 2021-09-29 MED ORDER — HYDRALAZINE HCL 20 MG/ML IJ SOLN: 10.0000 mg | INTRAMUSCULAR | Status: DC | PRN

## 2021-09-29 MED ORDER — SENNOSIDES-DOCUSATE SODIUM 8.6-50 MG PO TABS
1.0000 | ORAL_TABLET | Freq: Every evening | ORAL | Status: DC | PRN
Start: 2021-09-29 — End: 2021-09-30

## 2021-09-29 MED ORDER — IPRATROPIUM-ALBUTEROL 0.5-2.5 (3) MG/3ML IN SOLN
3.0000 mL | RESPIRATORY_TRACT | Status: DC | PRN
Start: 2021-09-29 — End: 2021-09-30

## 2021-09-29 MED ORDER — INSULIN ASPART 100 UNIT/ML IJ SOLN: 0.0000 [IU] | Freq: Every day | INTRAMUSCULAR | Status: DC

## 2021-09-29 NOTE — Progress Notes (Signed)
PROGRESS NOTE    Alexandria Goodman  WUG:[id number] DOB: 1936-07-26 DOA: 09/28/2021 PCP: System, Provider Not In   Brief Narrative:  86 year old with history of morbid obesity, HTN, diastolic CHF, aortic valve stenosis, DM2, cholecystectomy to the ED with complaints of nausea vomiting diarrhea which started around 4 AM on the day of admission along with generalized abdominal pain.  Patient does have several sick family members with similar symptoms.  Upon admission she was found to have some transaminitis with CT scan showing concerns of gastroenteritis and rectal mass.  GI team is consulted.  There is also 5 mm nonobstructive left-sided renal stone.  Right upper quadrant showed history of cholecystectomy without biliary dilatation.   Assessment and Plan: * Gastroenteritis Suspect viral gastroenteritis.  Multiple family members have been sick IV fluids Stool studies Supportive care.  Holding off on antibiotics but getting treatment for UTI  UTI (urinary tract infection) Patient is having dysuria.  Will check urine culture.  Start empiric IV Rocephin.  Rectal mass GI team has been consulted for possible colonoscopy and their input.  Chronic diastolic CHF (congestive heart failure) (HCC) Not in current exacerbation, stable Coreg, losartan Bumex on hold due to clinical dehydration  Diabetes mellitus (HCC) Sliding scale and Accu-Cheks D5 fluids.  Abdominal pain- (present on admission) Suspect secondary to acute viral gastroenteritis. Stool studies have been ordered IV fluids, supportive care Diet as tolerated Slightly elevated LFTs and lipase.  But improved this morning Right upper quadrant ultrasound- history of cholecystectomy.  No biliary dilatation.  Aortic stenosis Echo 10/2020-EF 60 to 65%, grade 1 DD. 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  Weight loss, diet and  exercise  Essential hypertension- (present on admission) Continue Coreg, losartan  AF (paroxysmal atrial fibrillation) (HCC)- (present on admission) Patient is currently on Coreg for rate control Eliquis on hold in anticipation for possible colonoscopy and biopsy     DVT prophylaxis: SCDs for now, at home on Eliquis Code Status: Full  Family Communication:  Called Jody  Remains in the hospital for IV Abx, IVF. Very weakn.        Subjective: Feels very weak. Poor oral intake. Still having diarrhea.    Examination:  Constitutional: Not in acute distress; clinically dehydrated dry mouth. Overall very weakn appearing.  Respiratory: Clear to auscultation bilaterally Cardiovascular: Normal sinus rhythm, no rubs Abdomen: Nontender nondistended good bowel sounds Musculoskeletal: No edema noted Skin: No rashes seen Neurologic: CN 2-12 grossly intact.  And nonfocal Psychiatric: Normal judgment and insight. Alert and oriented x 3. Normal mood.    Objective: Vitals:   09/29/21 0207 09/29/21 0348 09/29/21 0735 09/29/21 1031  BP: (!) 112/47 (!) 130/115 (!) 123/55 (!) 136/57  Pulse: 87 78 72 86  Resp: 16 16 20    Temp: 100.3 F (37.9 C) 98.3 F (36.8 C) 98.7 F (37.1 C)   TempSrc: Oral     SpO2: 93% 96% 92% 94%  Weight:      Height:        Intake/Output Summary (Last 24 hours) at 09/29/2021 1158 Last data filed at 09/29/2021 0400 Gross per 24 hour  Intake 1208.17 ml  Output 550 ml  Net 658.17 ml   Filed Weights   09/28/21 1030  Weight: 77.1 kg     Data Reviewed:   CBC: Recent Labs  Lab 09/28/21 1039 09/29/21 0416  WBC 11.5* 7.1  HGB 14.1 11.7*  HCT 44.5  36.5  MCV 95.7 95.5  PLT 124* 0000000*   Basic Metabolic Panel: Recent Labs  Lab 09/28/21 1039 09/29/21 0416  NA 138 135  K 4.4 3.8  CL 104 104  CO2 28 28  GLUCOSE 154* 116*  BUN 23 23  CREATININE 0.75 0.83  CALCIUM 9.3 8.3*   GFR: Estimated Creatinine Clearance: 40.5 mL/min (by C-G formula  based on SCr of 0.83 mg/dL). Liver Function Tests: Recent Labs  Lab 09/28/21 1039 09/29/21 0416  AST 60* 36  ALT 55* 36  ALKPHOS 216* 154*  BILITOT 0.9 0.9  PROT 7.6 6.1*  ALBUMIN 3.4* 2.7*   Recent Labs  Lab 09/28/21 1039 09/29/21 0416  LIPASE 239* 51   No results for input(s): AMMONIA in the last 168 hours. Coagulation Profile: No results for input(s): INR, PROTIME in the last 168 hours. Cardiac Enzymes: No results for input(s): CKTOTAL, CKMB, CKMBINDEX, TROPONINI in the last 168 hours. BNP (last 3 results) No results for input(s): PROBNP in the last 8760 hours. HbA1C: Recent Labs    09/28/21 1039  HGBA1C 5.9*   CBG: Recent Labs  Lab 09/28/21 1724 09/28/21 2140 09/29/21 0817  GLUCAP 167* 100* 91   Lipid Profile: No results for input(s): CHOL, HDL, LDLCALC, TRIG, CHOLHDL, LDLDIRECT in the last 72 hours. Thyroid Function Tests: No results for input(s): TSH, T4TOTAL, FREET4, T3FREE, THYROIDAB in the last 72 hours. Anemia Panel: No results for input(s): VITAMINB12, FOLATE, FERRITIN, TIBC, IRON, RETICCTPCT in the last 72 hours. Sepsis Labs: No results for input(s): PROCALCITON, LATICACIDVEN in the last 168 hours.  Recent Results (from the past 240 hour(s))  Resp Panel by RT-PCR (Flu A&B, Covid) Nasopharyngeal Swab     Status: None   Collection Time: 09/28/21 12:30 PM   Specimen: Nasopharyngeal Swab; Nasopharyngeal(NP) swabs in vial transport medium  Result Value Ref Range Status   SARS Coronavirus 2 by RT PCR NEGATIVE NEGATIVE Final    Comment: (NOTE) SARS-CoV-2 target nucleic acids are NOT DETECTED.  The SARS-CoV-2 RNA is generally detectable in upper respiratory specimens during the acute phase of infection. The lowest concentration of SARS-CoV-2 viral copies this assay can detect is 138 copies/mL. A negative result does not preclude SARS-Cov-2 infection and should not be used as the sole basis for treatment or other patient management decisions. A  negative result may occur with  improper specimen collection/handling, submission of specimen other than nasopharyngeal swab, presence of viral mutation(s) within the areas targeted by this assay, and inadequate number of viral copies(<138 copies/mL). A negative result must be combined with clinical observations, patient history, and epidemiological information. The expected result is Negative.  Fact Sheet for Patients:  EntrepreneurPulse.com.au  Fact Sheet for Healthcare Providers:  IncredibleEmployment.be  This test is no t yet approved or cleared by the [place] FDA and  has been authorized for detection and/or diagnosis of SARS-CoV-2 by FDA under an Emergency Use Authorization (EUA). This EUA will remain  in effect (meaning this test can be used) for the duration of the COVID-19 declaration under Section 564(b)(1) of the Act, 21 U.S.C.section 360bbb-3(b)(1), unless the authorization is terminated  or revoked sooner.       Influenza A by PCR NEGATIVE NEGATIVE Final   Influenza B by PCR NEGATIVE NEGATIVE Final    Comment: (NOTE) The Xpert Xpress SARS-CoV-2/FLU/RSV plus assay is intended as an aid in the diagnosis of influenza from Nasopharyngeal swab specimens and should not be used as a sole basis for treatment. Nasal washings and aspirates  are unacceptable for Xpert Xpress SARS-CoV-2/FLU/RSV testing.  Fact Sheet for Patients: EntrepreneurPulse.com.au  Fact Sheet for Healthcare Providers: IncredibleEmployment.be  This test is not yet approved or cleared by the [place] FDA and has been authorized for detection and/or diagnosis of SARS-CoV-2 by FDA under an Emergency Use Authorization (EUA). This EUA will remain in effect (meaning this test can be used) for the duration of the COVID-19 declaration under Section 564(b)(1) of the Act, 21 U.S.C. section 360bbb-3(b)(1), unless the authorization  is terminated or revoked.  Performed at [facility], [address] [place], [place] [address]          Radiology Studies: CT ABDOMEN PELVIS W CONTRAST  Result Date: 09/28/2021 CLINICAL DATA:  Abdominal pain. Nausea, vomiting, diarrhea since 4 a.m. EXAM: CT ABDOMEN AND PELVIS WITH CONTRAST TECHNIQUE: Multidetector CT imaging of the abdomen and pelvis was performed using the standard protocol following bolus administration of intravenous contrast. RADIATION DOSE REDUCTION: This exam was performed according to the departmental dose-optimization program which includes automated exposure control, adjustment of the mA and/or kV according to patient size and/or use of iterative reconstruction technique. CONTRAST:  16mL OMNIPAQUE IOHEXOL 300 MG/ML  SOLN COMPARISON:  06/12/2013 FINDINGS: Lower chest: Scarring medially in the right middle lobe with a somewhat rounded subpleural nodular component measuring 1.2 by 0.7 cm, similar although smaller appearance measuring 1.0 by 0.4 cm on 06/12/2013. Mild dependent atelectasis in the right lower lobe. Stable 6 by 3 mm subpleural nodule in the left lower lobe on image 17 series 4. Aortic valve replacement. Right coronary artery stent. Dense mitral valve calcification. Thickened interventricular septum up to 2.1 cm suggesting left ventricular hypertrophy. 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 Cholecystectomy. Pancreas: Unremarkable Spleen: Unremarkable Adrenals/Urinary Tract: Both adrenal glands appear normal. Fluid density 2.6 by 2.2 cm cyst of the right kidney upper pole anteriorly on image 30 series 2. Nonspecific 1.0 by 1.1 cm hypodense lesion in the left mid to lower kidney on image 41 series 2, sharply defined and probably a cyst. 5 mm nonobstructive left renal calculus on image 39 series 2. No hydronephrosis or hydroureter. Urinary bladder  unremarkable. Stomach/Bowel: Transverse duodenal diverticulum without inflammatory findings. The appendix is not well seen. Sigmoid diverticulosis without active diverticulitis. No dilated bowel. Mildly accentuated density in the rectum on image 73 series 2 may be incidental but I cannot exclude a rectal mass. Scattered air-fluid levels in nondilated loops of proximal jejunum are nonspecific not necessarily abnormal could reflect low-grade enteritis. Vascular/Lymphatic: Somewhat dilated celiac trunk at 1.4 cm diameter (previously 1.2 cm on 07/13/2013). Atherosclerosis is present, including aortoiliac atherosclerotic disease. There is some calcific plaque in the SMA with contrast visible in the SMA hence no overt occlusion. Porta hepatis node mildly prominent at 1.0 cm short axis. Peripancreatic lymph node mildly prominent at 1.1 cm in short axis. Both are shown on image 27 series 2. Portacaval node 1.3 cm in short axis on image 31 series 2, within normal limits for this location. Reproductive: Uterus absent.  Adnexa unremarkable. Other: No supplemental non-categorized findings. Musculoskeletal: Small umbilical hernia contains adipose tissue, image 62 series 6. Grade 1 degenerative anterolisthesis at L5-S1. Stable small metal foreign body along the right upper anterior thoracoabdominal wall, image 20 series 2. IMPRESSION: 1. Scattered air-fluid levels in nondilated loops of proximal jejunum, nonspecific and not necessarily abnormal, but conceivably a subtle manifestation of mild proximal enteritis. 2. Accentuated density in the  rectum could be from local wall thickening or rectal contents. Strictly speaking a rectal mass is not excluded. Correlation with the patient's colon cancer screening history is recommended. If screening is not up-to-date, appropriate screening should be considered. 3. Chronic scarring in the right middle lobe, with a nodular component slightly more prominent than on 06/12/2013. Given that this  was present although less prominent 9 years ago, this is most likely benign. 4. Borderline to mildly prominent porta hepatis lymph nodes, probably reactive but technically nonspecific. 5. Other imaging findings of potential clinical significance: Aortic valve replacement. Dense mitral valve calcification. Left ventricular hypertrophy. 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 5 mm nonobstructive left renal calculus. Sigmoid diverticulosis. Chronic mildly dilated celiac trunk. Atherosclerosis is present, including aortoiliac atherosclerotic disease. Small umbilical hernia contains adipose tissue. Electronically Signed   By: Van Clines M.D.   On: 09/28/2021 12:58   DG Chest Portable 1 View  Result Date: 09/28/2021 CLINICAL DATA:  Cough 1 month EXAM: PORTABLE CHEST 1 VIEW COMPARISON:  08/24/2021 FINDINGS: Heart size upper normal. Postop TAVR unchanged in position. Pulmonary vascularity normal. Mild bibasilar airspace disease.  No effusion. IMPRESSION: Mild bibasilar airspace disease may represent atelectasis or pneumonia. Electronically Signed   By: Franchot Gallo M.D.   On: 09/28/2021 11:49   US Abdomen Limited RUQ (LIVER/GB)  Result Date: 09/28/2021 CLINICAL DATA:  Abdomen pain EXAM: ULTRASOUND ABDOMEN LIMITED RIGHT UPPER QUADRANT COMPARISON:  CT 09/28/2021 FINDINGS: Gallbladder: Surgically absent Common bile duct: Diameter: 5.8 mm Liver: Echogenicity within normal limits. Dilated vascular structures in the left hepatic lobe corresponding to CT demonstrated vascular malformation. Portal vein is patent on color Doppler imaging with normal direction of blood flow towards the liver. Other: None. IMPRESSION: 1. Status post cholecystectomy without biliary dilatation 2. Redemonstrated left hepatic lobe vascular malformation Electronically Signed   By: Donavan Foil M.D.   On: 09/28/2021 15:35        Scheduled Meds:  carvedilol  6.25 mg Oral Daily   cholecalciferol  4,000  Units Oral Daily   DULoxetine  60 mg Oral Daily   insulin aspart  0-15 Units Subcutaneous TID WC   insulin aspart  0-5 Units Subcutaneous QHS   losartan  100 mg Oral Daily   multivitamin with minerals  1 tablet Oral Daily   Continuous Infusions:  cefTRIAXone (ROCEPHIN)  IV     dextrose 5 % and 0.45% NaCl       LOS: 0 days   Time spent= 35 mins    Mckennah Kretchmer Arsenio Loader, MD [facility]  If 7PM-7AM, please contact night-coverage  09/29/2021, 11:58 AM

## 2021-09-29 NOTE — Assessment & Plan Note (Addendum)
Suspect viral gastroenteritis.  Multiple family members have been sick IV fluids Stool studies Supportive care.  Holding off on antibiotics but getting treatment for UTI

## 2021-09-29 NOTE — Progress Notes (Signed)
OT Cancellation Note  Patient Details Name: Alexandria Goodman MRN: [id number] DOB: 07-06-1936   Cancelled Treatment:    Reason Eval/Treat Not Completed: Fatigue/lethargy limiting ability to participate (Pt. was sleeping in the recliner upon arrival. Pt. was lethargic. Pt. was groaning, and reportied that her leg was hurting. Pt. was assisted with repositioning of LEs on a pillow, and the back of the recliner was set in the reclining position.) Pt. Went back to sleep. Will reattempt the initial OT eval when pt. is able to alert, and fully engage in the session.  Olegario Messier, MS, OTR/L  Olegario Messier 09/29/2021, 4:31 PM

## 2021-09-29 NOTE — Evaluation (Signed)
Physical Therapy Evaluation Patient Details Name: Alexandria Goodman MRN: [id number] DOB: 05-16-1936 Today's Date: 09/29/2021  History of Present Illness  86 year old with history of morbid obesity, HTN, diastolic CHF, aortic valve stenosis, DM2, cholecystectomy to the ED with complaints of nausea vomiting diarrhea which started around 4 AM on the day of admission along with generalized abdominal pain.  Patient does have several sick family members with similar symptoms.  Upon admission she was found to have some transaminitis with CT scan showing concerns of gastroenteritis and rectal mass.  Clinical Impression  Pt initially not too interested in doing a lot with PT but ultimately did want to get up to the recliner and was willing to do some minimal activity.  Pt clearly frustrated with her current situation both at home and her in the hospital and needing to vent about the difficulty she has had over the last year and her concerns about the coming months.  Apparently she has been looking for senior living options (discussed with TOC team to assist) as she is apparently relatively independent at baseline; she is currently not near her baseline and PT is recommending HHPT f/u once she is ready for d/c.       Recommendations for follow up therapy are one component of a multi-disciplinary discharge planning process, led by the attending physician.  Recommendations may be updated based on patient status, additional functional criteria and insurance authorization.  Follow Up Recommendations Home health PT    Assistance Recommended at Discharge Set up Supervision/Assistance  Patient can return home with the following  Assistance with cooking/housework;Assist for transportation    Equipment Recommendations None recommended by PT  Recommendations for Other Services       Functional Status Assessment Patient has had a recent decline in their functional status and demonstrates the ability to make  significant improvements in function in a reasonable and predictable amount of time.     Precautions / Restrictions Precautions Precautions: Fall Restrictions Weight Bearing Restrictions: No      Mobility  Bed Mobility Overal bed mobility: Modified Independent             General bed mobility comments: Pt able to get herself up to EOB slowly but w/o assist    Transfers Overall transfer level: Modified independent Equipment used: Rolling walker (2 wheels)               General transfer comment: Pt able to rise to standing w/o assist, showed slow and hesitant effort with no LOBs or safety concerns    Ambulation/Gait Ambulation/Gait assistance: Supervision Gait Distance (Feet): 5 Feet Assistive device: Rolling walker (2 wheels)         General Gait Details: Pt did not wish to walk far but took a few steps to the recliner.  Pt did not appear overly reliant on the walker but would likely have struggled w/o some UE support (no AD is apparently her baseline)  Careers information officer    Modified Rankin (Stroke Patients Only)       Balance Overall balance assessment: Modified Independent                                           Pertinent Vitals/Pain Pain Assessment Pain Assessment: Faces Faces Pain Scale: Hurts a little bit Pain Location: abdominal pain  Home Living Family/patient expects to be discharged to:: Unsure Living Arrangements: Children                 Additional Comments: pt lives with daughter and multiple other family members but aparently working on finding a senior living set up or similar as she states they are being evicted in a month or 2.    Prior Function Prior Level of Function : Independent/Modified Independent             Mobility Comments: Pt reports she has a rollator if she needs it but typically is able to be active and independent w/o       Hand Dominance         Extremity/Trunk Assessment   Upper Extremity Assessment Upper Extremity Assessment: Generalized weakness    Lower Extremity Assessment Lower Extremity Assessment: Generalized weakness       Communication   Communication: No difficulties  Cognition Arousal/Alertness: Awake/alert Behavior During Therapy: WFL for tasks assessed/performed Overall Cognitive Status: Within Functional Limits for tasks assessed                                          General Comments General comments (skin integrity, edema, etc.): Pt spent much of the session describing how difficult her last year has been and voicing frustration for her situation.  Able to move relatively well but not interested in doing a lot of physical activity    Exercises     Assessment/Plan    PT Assessment Patient needs continued PT services  PT Problem List Decreased strength;Decreased activity tolerance;Decreased balance;Decreased mobility;Decreased knowledge of use of DME;Decreased safety awareness       PT Treatment Interventions DME instruction;Gait training;Stair training;Functional mobility training;Therapeutic activities;Therapeutic exercise;Balance training;Neuromuscular re-education;Patient/family education    PT Goals (Current goals can be found in the Care Plan section)  Acute Rehab PT Goals Patient Stated Goal: find a senior apartment that won't charge her "an arm and a leg" PT Goal Formulation: With patient Time For Goal Achievement: 10/13/21 Potential to Achieve Goals: Good    Frequency Min 2X/week     Co-evaluation               AM-PAC PT "6 Clicks" Mobility  Outcome Measure Help needed turning from your back to your side while in a flat bed without using bedrails?: None Help needed moving from lying on your back to sitting on the side of a flat bed without using bedrails?: None Help needed moving to and from a bed to a chair (including a wheelchair)?: None Help needed  standing up from a chair using your arms (e.g., wheelchair or bedside chair)?: None Help needed to walk in hospital room?: A Little Help needed climbing 3-5 steps with a railing? : A Little 6 Click Score: 22    End of Session Equipment Utilized During Treatment: Gait belt Activity Tolerance: Patient tolerated treatment well Patient left: with chair alarm set;with call bell/phone within reach Nurse Communication: Mobility status PT Visit Diagnosis: Muscle weakness (generalized) (M62.81);Difficulty in walking, not elsewhere classified (R26.2)    Time: 1415-1450 PT Time Calculation (min) (ACUTE ONLY): 35 min   Charges:   PT Evaluation $PT Eval Low Complexity: 1 Low PT Treatments $Therapeutic Activity: 8-22 mins        Malachi Pro, DPT 09/29/2021, 4:25 PM

## 2021-09-29 NOTE — Assessment & Plan Note (Addendum)
Echo 10/2020-EF 60 to 65%, grade 1 DD. S/p Recent TAVR  Oct 2022

## 2021-09-29 NOTE — Consult Note (Addendum)
Alexandria Goodman , MD [address] [place], [place], [place], [address] [address], [place], [place], [place], [address] Phone: [phone]  Fax: [phone]  Consultation  Referring Provider:     Dr Francine Graven Primary Care Physician:  System, Provider Not In Primary Gastroenterologist:  None          Reason for Consultation:     Rectal mass  Date of Admission:  09/28/2021 Date of Consultation:  09/29/2021         HPI:   Alexandria Goodman is a 86 y.o. female Presented to the emergency room with abdominal pain.  History of obesity hypertension chronic diastolic dysfunction aortic valve stenosis diabetes.  All her symptoms began all of a sudden yesterday with abdominal pain nausea vomiting and diarrhea.  All of her family members have been sick and she was on the last to develop symptoms.  Came into the emergency room and she had a CT scan of the abdomen that showed scattered air-fluid levels in the proximal jejunum possible proximal enteritis.  Density in the rectum could be from local wall thickening or rectal contents rectal masses not excluded and evaluation was recommended.Patient is on Eliquis which has been held.  She states that her GI symptoms have resolved.  Denies any rectal bleeding.  Denies any change in her bowel habits.  She cannot recollect when her last colonoscopy was.  She was a bit confused when I went to see her was not able to give a good history.  Past Medical History:  Diagnosis Date   CHF (congestive heart failure) ([place])    Depression    Dysrhythmia    Hypertension     History reviewed. No pertinent surgical history.  Prior to Admission medications   Medication Sig Start Date End Date Taking? Authorizing Provider  apixaban (ELIQUIS) 5 MG TABS tablet Take 5 mg by mouth 2 (two) times daily. 02/20/20  Yes [provider]  bumetanide (BUMEX) 2 MG tablet 2 mg 2 (two) times daily. 02/20/20  Yes [provider]  carvedilol (COREG) 6.25 MG tablet Take 6.25  mg by mouth daily. 02/20/20 09/28/21 Yes [provider]  Cholecalciferol 50 MCG (2000 UT) CAPS Take 4,000 Units by mouth daily.   Yes [provider]  docusate sodium (COLACE) 100 MG capsule Take 300 mg by mouth at bedtime.   Yes [provider]  DULoxetine (CYMBALTA) 60 MG capsule Take 60 mg by mouth daily. 07/01/18  Yes [provider]  insulin NPH-regular Human (70-30) 100 UNIT/ML injection Inject 5-10 Units into the skin 3 (three) times daily. 08/12/21 08/12/22 Yes [provider]  losartan (COZAAR) 50 MG tablet Take 100 mg by mouth daily. 08/26/20  Yes [provider]  Multiple Vitamin (MULTI-VITAMIN) tablet Take 1 tablet by mouth daily.   Yes [provider]  oxyCODONE-acetaminophen (PERCOCET) 10-325 MG tablet Take 1 tablet by mouth 3 (three) times daily as needed. 10/09/20  Yes [provider]  bisacodyl (DULCOLAX) 5 MG EC tablet Take 5 mg by mouth daily as needed.    [provider]  BRILINTA 90 MG TABS tablet Take 90 mg by mouth 2 (two) times daily. Patient not taking: Reported on 09/28/2021 08/01/21   [provider]  diclofenac Sodium (VOLTAREN) 1 % GEL Apply 2 g topically 4 (four) times daily.    [provider]    Family History  Family history unknown: Yes     Social History   Tobacco Use  Smoking status: Never  Substance Use Topics   Alcohol use: Never   Drug use: Never    Allergies as of 09/28/2021 - Review Complete 09/28/2021  Allergen Reaction Noted   Alendronate Other (See Comments) 12/09/2013   Dilaudid [hydromorphone] Rash 11/02/2020   Monosodium glutamate Anaphylaxis 11/02/2020   Alendronate sodium  12/09/2013   Levofloxacin Hives 11/02/2020   Lisinopril Cough 11/02/2020   Morphine and related Hives 11/02/2020   Pravastatin Itching 11/02/2020   Solifenacin Other (See Comments) 11/02/2020   Tradjenta [linagliptin]  11/02/2020   Trospium  11/02/2020   Augmentin  [amoxicillin-pot clavulanate]  11/02/2020   Sulfa antibiotics Rash 11/02/2020    Review of Systems:    All systems reviewed and negative except where noted in HPI.   Physical Exam:  Vital signs in last 24 hours: Temp:  [98.1 F (36.7 C)-101.5 F (38.6 C)] 98.7 F (37.1 C) (02/16 0735) Pulse Rate:  [71-97] 72 (02/16 0735) Resp:  [16-20] 20 (02/16 0735) BP: (112-181)/(47-115) 123/55 (02/16 0735) SpO2:  [90 %-99 %] 92 % (02/16 0735) Weight:  [77.1 kg] 77.1 kg (02/15 1030) Last BM Date : 09/28/21 General: Alert and awake but appeared a bit confused Head:  Normocephalic and atraumatic. Eyes:   No icterus.   Conjunctiva pink. PERRLA. Ears:  Normal auditory acuity. Neck:  Supple; no masses or thyroidomegaly Lungs: Respirations even and unlabored. Lungs clear to auscultation bilaterally.   No wheezes, crackles, or rhonchi.  Heart:  Regular rate and rhythm;  Without murmur, clicks, rubs or gallops Abdomen:  Soft, nondistended, nontender. Normal bowel sounds. No appreciable masses or hepatomegaly.  No rebound or guarding.  Neurologic:  Alert and oriented x2;  grossly normal neurologically. Skin:  Intact without significant lesions or rashes. Cervical Nodes:  No significant cervical adenopathy. Psych:  Alert and cooperative. Normal affect.  LAB RESULTS: Recent Labs    09/28/21 1039 09/29/21 0416  WBC 11.5* 7.1  HGB 14.1 11.7*  HCT 44.5 36.5  PLT 124* 109*   BMET Recent Labs    09/28/21 1039 09/29/21 0416  NA 138 135  K 4.4 3.8  CL 104 104  CO2 28 28  GLUCOSE 154* 116*  BUN 23 23  CREATININE 0.75 0.83  CALCIUM 9.3 8.3*   LFT Recent Labs    09/29/21 0416  PROT 6.1*  ALBUMIN 2.7*  AST 36  ALT 36  ALKPHOS 154*  BILITOT 0.9  BILIDIR 0.2  IBILI 0.7   PT/INR No results for input(s): LABPROT, INR in the last 72 hours.  STUDIES: CT ABDOMEN PELVIS W CONTRAST  Result Date: 09/28/2021 CLINICAL DATA:  Abdominal pain. Nausea, vomiting, diarrhea since 4 a.m. EXAM: CT  ABDOMEN AND PELVIS WITH CONTRAST TECHNIQUE: Multidetector CT imaging of the abdomen and pelvis was performed using the standard protocol following bolus administration of intravenous contrast. RADIATION DOSE REDUCTION: This exam was performed according to the departmental dose-optimization program which includes automated exposure control, adjustment of the mA and/or kV according to patient size and/or use of iterative reconstruction technique. CONTRAST:  158mL OMNIPAQUE IOHEXOL 300 MG/ML  SOLN COMPARISON:  06/12/2013 FINDINGS: Lower chest: Scarring medially in the right middle lobe with a somewhat rounded subpleural nodular component measuring 1.2 by 0.7 cm, similar although smaller appearance measuring 1.0 by 0.4 cm on 06/12/2013. Mild dependent atelectasis in the right lower lobe. Stable 6 by 3 mm subpleural nodule in the left lower lobe on image 17 series 4. Aortic valve replacement. Right coronary artery stent. Dense mitral valve calcification.  Thickened interventricular septum up to 2.1 cm suggesting left ventricular hypertrophy. 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 Cholecystectomy. Pancreas: Unremarkable Spleen: Unremarkable Adrenals/Urinary Tract: Both adrenal glands appear normal. Fluid density 2.6 by 2.2 cm cyst of the right kidney upper pole anteriorly on image 30 series 2. Nonspecific 1.0 by 1.1 cm hypodense lesion in the left mid to lower kidney on image 41 series 2, sharply defined and probably a cyst. 5 mm nonobstructive left renal calculus on image 39 series 2. No hydronephrosis or hydroureter. Urinary bladder unremarkable. Stomach/Bowel: Transverse duodenal diverticulum without inflammatory findings. The appendix is not well seen. Sigmoid diverticulosis without active diverticulitis. No dilated bowel. Mildly accentuated density in the rectum on image 73 series 2 may be incidental but I cannot  exclude a rectal mass. Scattered air-fluid levels in nondilated loops of proximal jejunum are nonspecific not necessarily abnormal could reflect low-grade enteritis. Vascular/Lymphatic: Somewhat dilated celiac trunk at 1.4 cm diameter (previously 1.2 cm on 07/13/2013). Atherosclerosis is present, including aortoiliac atherosclerotic disease. There is some calcific plaque in the SMA with contrast visible in the SMA hence no overt occlusion. Porta hepatis node mildly prominent at 1.0 cm short axis. Peripancreatic lymph node mildly prominent at 1.1 cm in short axis. Both are shown on image 27 series 2. Portacaval node 1.3 cm in short axis on image 31 series 2, within normal limits for this location. Reproductive: Uterus absent.  Adnexa unremarkable. Other: No supplemental non-categorized findings. Musculoskeletal: Small umbilical hernia contains adipose tissue, image 62 series 6. Grade 1 degenerative anterolisthesis at L5-S1. Stable small metal foreign body along the right upper anterior thoracoabdominal wall, image 20 series 2. IMPRESSION: 1. Scattered air-fluid levels in nondilated loops of proximal jejunum, nonspecific and not necessarily abnormal, but conceivably a subtle manifestation of mild proximal enteritis. 2. Accentuated density in the rectum could be from local wall thickening or rectal contents. Strictly speaking a rectal mass is not excluded. Correlation with the patient's colon cancer screening history is recommended. If screening is not up-to-date, appropriate screening should be considered. 3. Chronic scarring in the right middle lobe, with a nodular component slightly more prominent than on 06/12/2013. Given that this was present although less prominent 9 years ago, this is most likely benign. 4. Borderline to mildly prominent porta hepatis lymph nodes, probably reactive but technically nonspecific. 5. Other imaging findings of potential clinical significance: Aortic valve replacement. Dense mitral  valve calcification. Left ventricular hypertrophy. 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 5 mm nonobstructive left renal calculus. Sigmoid diverticulosis. Chronic mildly dilated celiac trunk. Atherosclerosis is present, including aortoiliac atherosclerotic disease. Small umbilical hernia contains adipose tissue. Electronically Signed   By: Van Clines M.D.   On: 09/28/2021 12:58   DG Chest Portable 1 View  Result Date: 09/28/2021 CLINICAL DATA:  Cough 1 month EXAM: PORTABLE CHEST 1 VIEW COMPARISON:  08/24/2021 FINDINGS: Heart size upper normal. Postop TAVR unchanged in position. Pulmonary vascularity normal. Mild bibasilar airspace disease.  No effusion. IMPRESSION: Mild bibasilar airspace disease may represent atelectasis or pneumonia. Electronically Signed   By: Franchot Gallo M.D.   On: 09/28/2021 11:49   US Abdomen Limited RUQ (LIVER/GB)  Result Date: 09/28/2021 CLINICAL DATA:  Abdomen pain EXAM: ULTRASOUND ABDOMEN LIMITED RIGHT UPPER QUADRANT COMPARISON:  CT 09/28/2021 FINDINGS: Gallbladder: Surgically absent Common bile duct: Diameter: 5.8 mm Liver: Echogenicity within normal limits. Dilated vascular structures in the left hepatic lobe corresponding to CT  demonstrated vascular malformation. Portal vein is patent on color Doppler imaging with normal direction of blood flow towards the liver. Other: None. IMPRESSION: 1. Status post cholecystectomy without biliary dilatation 2. Redemonstrated left hepatic lobe vascular malformation Electronically Signed   By: Donavan Foil M.D.   On: 09/28/2021 15:35      Impression / Plan:   Lafonda Baires is a 86 y.o. y/o female presented to the emergency room with nausea vomiting abdominal pain.  Multiple household contacts had similar symptoms.  History suggestive of infectious etiology that has resolved as expected.  Incidental finding of possible rectal contents versus mass on CT scan.  She is on Eliquis.  Plan 1.  She will  need a colonoscopy to evaluate further when her nausea vomiting has resolved which she has at this point but she feels very weak and I am not sure if she can do a bowel prep at this point of time.  I will reevaluate her tomorrow and she is to be 2 days off Eliquis and can potentially do her procedure on Saturday if she is able to tolerate the prep on my assessment tomorrow.  Thank you for involving me in the care of this patient.      LOS: 0 days   Alexandria Bellows, MD  09/29/2021, 9:24 AM

## 2021-09-29 NOTE — Assessment & Plan Note (Addendum)
Sliding scale and Accu-Cheks D5 fluids.

## 2021-09-29 NOTE — Assessment & Plan Note (Addendum)
Not in current exacerbation, stable Coreg, losartan Bumex on hold due to clinical dehydration

## 2021-09-29 NOTE — Assessment & Plan Note (Signed)
Patient is currently on Coreg for rate control Eliquis on hold in anticipation for possible colonoscopy and biopsy

## 2021-09-29 NOTE — Assessment & Plan Note (Signed)
GI team has been consulted for possible colonoscopy and their input.

## 2021-09-29 NOTE — Assessment & Plan Note (Signed)
Patient is having dysuria.  Will check urine culture.  Start empiric IV Rocephin.

## 2021-09-29 NOTE — Assessment & Plan Note (Addendum)
-  Continue Coreg, losartan 

## 2021-09-29 NOTE — Assessment & Plan Note (Signed)
Outpatient follow-up with PCP.  Weight loss, diet and exercise

## 2021-09-29 NOTE — Progress Notes (Signed)
Patient felt warm to touch through gloves to this care RN at 0100 while ambulating to the bathroom. Care RN checked Temp and VS and yellow MEWs was populated due to fever of 101.72F. Other VSS at baseline. Pt stated she felt fine just warm. On call provider notified of this, per provider no further workup just recheck of VS prior to 1 hr mark. PRN tylenol given with resolution of fever. Recheck of vital within 1 hr done with green MEWs since fever was down trending. No loose BM overnight, pt did have scant amount of brown smear on the incontinence pad X2 but no true BM.   New set of purwick applied to collect clean urine sample.

## 2021-09-29 NOTE — Assessment & Plan Note (Addendum)
Suspect secondary to acute viral gastroenteritis. Stool studies have been ordered IV fluids, supportive care Diet as tolerated Slightly elevated LFTs and lipase.  But improved this morning Right upper quadrant ultrasound- history of cholecystectomy.  No biliary dilatation.

## 2021-09-29 NOTE — Progress Notes (Signed)
°   09/29/21 0107  Assess: MEWS Score  BP (!) 124/51  Pulse Rate 84  Resp 20  SpO2 94 %  O2 Device Room Air  Assess: MEWS Score  MEWS Temp 2  MEWS Systolic 0  MEWS Pulse 0  MEWS RR 0  MEWS LOC 0  MEWS Score 2  MEWS Score Color Yellow  Assess: if the MEWS score is Yellow or Red  Were vital signs taken at a resting state? Yes  Focused Assessment No change from prior assessment  Does the patient meet 2 or more of the SIRS criteria? No  MEWS guidelines implemented *See Row Information* No, vital signs rechecked  Notify: Charge Nurse/RN  Name of Charge Nurse/RN Notified Stacy RN  Date Charge Nurse/RN Notified 09/29/21  Time Charge Nurse/RN Notified 0120  Notify: Provider  Provider Name/Title Randol Kern APP  Date Provider Notified 09/29/21  Time Provider Notified 0110  Notification Type  (secure chat)  Notification Reason Other (Comment) (yellow MEW fever 101.41F, other VSS at baseline; giving PRN tylenol for fever and will recheck VS)  Document  Patient Outcome Stabilized after interventions  Progress note created (see row info) Yes  Assess: SIRS CRITERIA  SIRS Temperature  1  SIRS Pulse 0  SIRS Respirations  0  SIRS WBC 0  SIRS Score Sum  1

## 2021-09-30 LAB — COMPREHENSIVE METABOLIC PANEL
ALT: 33 U/L (ref 0–44)
AST: 37 U/L (ref 15–41)
Albumin: 2.7 g/dL — ABNORMAL LOW (ref 3.5–5.0)
Alkaline Phosphatase: 141 U/L — ABNORMAL HIGH (ref 38–126)
Anion gap: 4 — ABNORMAL LOW (ref 5–15)
BUN: 16 mg/dL (ref 8–23)
CO2: 28 mmol/L (ref 22–32)
Calcium: 8.5 mg/dL — ABNORMAL LOW (ref 8.9–10.3)
Chloride: 106 mmol/L (ref 98–111)
Creatinine, Ser: 0.72 mg/dL (ref 0.44–1.00)
GFR, Estimated: >60 mL/min (ref >60)
Glucose, Bld: 123 mg/dL — ABNORMAL HIGH (ref 70–99)
Potassium: 3.5 mmol/L (ref 3.5–5.1)
Sodium: 138 mmol/L (ref 135–145)
Total Bilirubin: 0.5 mg/dL (ref 0.3–1.2)
Total Protein: 6 g/dL — ABNORMAL LOW (ref 6.5–8.1)

## 2021-09-30 LAB — CBC
HCT: 36.9 % (ref 36.0–46.0)
Hemoglobin: 11.7 g/dL — ABNORMAL LOW (ref 12.0–15.0)
MCH: 30.5 pg (ref 26.0–34.0)
MCHC: 31.7 g/dL (ref 30.0–36.0)
MCV: 96.1 fL (ref 80.0–100.0)
Platelets: 98 10*3/uL — ABNORMAL LOW (ref 150–400)
RBC: 3.84 MIL/uL — ABNORMAL LOW (ref 3.87–5.11)
RDW: 13 % (ref 11.5–15.5)
WBC: 5.7 10*3/uL (ref 4.0–10.5)
nRBC: 0 % (ref 0.0–0.2)

## 2021-09-30 LAB — LIPASE, BLOOD: Lipase: 36 U/L (ref 11–51)

## 2021-09-30 LAB — GLUCOSE, CAPILLARY
Glucose-Capillary: 128 mg/dL — ABNORMAL HIGH (ref 70–99)
Glucose-Capillary: 186 mg/dL — ABNORMAL HIGH (ref 70–99)

## 2021-09-30 MED ORDER — CEPHALEXIN 500 MG PO CAPS: 500.0000 mg | ORAL_CAPSULE | Freq: Three times a day (TID) | ORAL | 0 refills | Status: AC

## 2021-09-30 MED ORDER — POTASSIUM CHLORIDE CRYS ER 20 MEQ PO TBCR
40.0000 meq | EXTENDED_RELEASE_TABLET | Freq: Once | ORAL | Status: AC
  Administered 2021-09-30: 40 meq via ORAL
  Filled 2021-09-30: qty 2

## 2021-09-30 NOTE — Progress Notes (Signed)
Gi NOTE  Informed patient has refused any procedures and is going home. Advised to follow up as outpatient to discuss further  Dr Wyline Mood MD,MRCP [facility]) Gastroenterology/Hepatology Pager: [phone]

## 2021-09-30 NOTE — TOC Initial Note (Signed)
Transition of Care [facility]) - Initial/Assessment Note    Patient Details  Name: Alexandria Goodman MRN: [id number] Date of Birth: 12-Sep-1935  Transition of Care [facility]) CM/SW Contact:    Beverly Sessions, RN Phone Number: 09/30/2021, 10:50 AM  Clinical Narrative:                 Admitted for: gastroenteritis  Admitted from: home with daughter.  They home they live in has 12 people, 10 dogs, and 5 cats.  Patient states she believes they are going to be evicted in the next 2 months.  Patient does not disclose how much income she receives a month.  She states she would like to live in a senior living community with monthly fees of $800.  She is agreeable for referral to [facility].   Referral made to [facility].  She will reach out to patient and determine if there is anything that meets the patients requests  PCP: Roselyn Meier Current home health/prior home health/DME: [facility]. Declines home health services at discharge   Daughter to transport at discharge    Expected Discharge Plan: Home/Self Care Barriers to Discharge: Barriers Resolved   Patient Goals and CMS Choice        Expected Discharge Plan and Services Expected Discharge Plan: Home/Self Care       Living arrangements for the past 2 months: Single Family Home Expected Discharge Date: 09/30/21                         HH Arranged: Refused HH          Prior Living Arrangements/Services Living arrangements for the past 2 months: Single Family Home Lives with:: Adult Children, Pets, Roommate, Friends Patient language and need for interpreter reviewed:: Yes Do you feel safe going back to the place where you live?: Yes      Need for Family Participation in Patient Care: Yes (Comment) Care giver support system in place?: Yes (comment)   Criminal Activity/Legal Involvement Pertinent to Current Situation/Hospitalization: No - Comment as needed  Activities of Daily Living Home Assistive Devices/Equipment: Eyeglasses,  Dentures (specify type), Walker (specify type) (upper only; pt lost lower in the past;) ADL Screening (condition at time of admission) Patient's cognitive ability adequate to safely complete daily activities?: Yes Is the patient deaf or have difficulty hearing?: Yes Does the patient have difficulty seeing, even when wearing glasses/contacts?: No Does the patient have difficulty concentrating, remembering, or making decisions?: No Patient able to express need for assistance with ADLs?: Yes Does the patient have difficulty dressing or bathing?: Yes Independently performs ADLs?: No Communication: Independent Dressing (OT): Needs assistance Grooming: Independent Feeding: Independent Bathing: Needs assistance Toileting: Needs assistance In/Out Bed: Needs assistance Walks in Home: Independent with device (comment) (walker) Does the patient have difficulty walking or climbing stairs?: Yes Weakness of Legs: Both Weakness of Arms/Hands: None  Permission Sought/Granted                  Emotional Assessment       Orientation: : Oriented to Self, Oriented to Place, Oriented to  Time, Oriented to Situation Alcohol / Substance Use: Not Applicable Psych Involvement: No (comment)  Admission diagnosis:  Enteritis [K52.9] Weakness [R53.1] Abdominal pain [R10.9] Patient Active Problem List   Diagnosis Date Noted   Chronic diastolic CHF (congestive heart failure) ([place]) 09/29/2021   Gastroenteritis 09/29/2021   Rectal mass 09/29/2021   UTI (urinary tract infection) 09/29/2021   Abdominal pain  09/28/2021   Diabetes mellitus ([place]) 09/28/2021   Hypokalemia 01/27/2021   Syncope and collapse 01/25/2021   Homelessness 01/25/2021   Obesity (BMI 35.0-39.9 without comorbidity) 01/25/2021   MDD (major depressive disorder), single episode, moderate ([place]) 11/02/2020   Atypical chest pain 11/02/2020   AF (paroxysmal atrial fibrillation) ([place]) 11/02/2020   Essential hypertension 11/02/2020    Obesity, Class III, BMI 40-49.9 (morbid obesity) ([place]) 11/02/2020   Adjustment disorder with mixed disturbance of emotions and conduct 11/02/2020   Aortic stenosis 11/02/2020   PCP:  System, Provider Not In Pharmacy:   [id number] - [place], [address] [place] [address] Phone: [phone] Fax: [phone]     Social Determinants of Health (SDOH) Interventions    Readmission Risk Interventions No flowsheet data found.

## 2021-09-30 NOTE — Discharge Summary (Signed)
Physician Discharge Summary  Mehek Zody [id number] DOB: 12-Jun-1936 DOA: 09/28/2021  PCP: System, Provider Not In  Admit date: 09/28/2021 Discharge date: 09/30/2021  Admitted From: Home Disposition: Home  Recommendations for Outpatient Follow-up:  Follow up with PCP in 1-2 weeks Please obtain BMP/CBC in one week your next doctors visit.  Oral Keflex for 2 more days to complete her course Will need outpatient colonoscopy to evaluate rectal mass.  Patient refused this procedure in the hospital She will need to clarify with outpatient cardiologist whether if she needs to continue Brilinta along with Eliquis.  Home Health: Equipment/Devices: Discharge Condition: Stable CODE STATUS:  Diet recommendation:   Brief/Interim Summary: 86 year old with history of morbid obesity, HTN, diastolic CHF, aortic valve stenosis, DM2, cholecystectomy to the ED with complaints of nausea vomiting diarrhea which started around 4 AM on the day of admission along with generalized abdominal pain.  Patient does have several sick family members with similar symptoms.  Upon admission she was found to have some transaminitis with CT scan showing concerns of gastroenteritis and rectal mass.  GI team is consulted.  There is also 5 mm nonobstructive left-sided renal stone.  Right upper quadrant showed history of cholecystectomy without biliary dilatation.  GI team offered colonoscopy to evaluate rectal mass but patient refused any sort of procedure.  Her symptoms have resolved and she would like to be discharged home. I spoke with her daughter yesterday but attempted to call her multiple times today but no answer therefore left a voicemail. Since patient is refusing any further treatment, she can be discharged with outpatient follow-up.     Assessment and Plan: * Gastroenteritis Patient's diarrhea has resolved, she is tolerating orals without any issues.   UTI (urinary tract infection) Urine cultures were not  sent prior to starting antibiotics although they were ordered.  We will transition IV Rocephin to oral Keflex.   Rectal mass GI team has been consulted for possible colonoscopy and their input.   Chronic diastolic CHF (congestive heart failure) (HCC) Not in current exacerbation, stable Resume her home medications   Diabetes mellitus ([place]) Resume home diabetic medications   Abdominal pain- (present on admission) Resolved Diet as tolerated Slightly elevated LFTs and lipase.  But improved this morning Right upper quadrant ultrasound- history of cholecystectomy.  No biliary dilatation.   Aortic stenosis Echo 10/2020-EF 60 to 65%, grade 1 DD. 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  Patient will need to clarify with her outpatient primary care and cardiology regarding her Brilinta.  She is already on Eliquis at this time.   Obesity, Class III, BMI 40-49.9 (morbid obesity) ([place])- (present on admission) Outpatient follow-up with PCP.  Weight loss, diet and exercise   Essential hypertension- (present on admission) Continue Coreg, losartan   AF (paroxysmal atrial fibrillation) ([place])- (present on admission) Patient is currently on Coreg for rate control.  Anticoagulation will be resumed upon discharge.  He was on hold in the hospital in anticipation for colonoscopy to evaluate rectal mass.     Body mass index is 40.99 kg/m.         Discharge Diagnoses:  Principal Problem:   Gastroenteritis Active Problems:   UTI (urinary tract infection)   AF (paroxysmal atrial fibrillation) (HCC)   Essential hypertension   Obesity, Class III, BMI 40-49.9 (morbid obesity) ([place])   Aortic stenosis   Abdominal pain   Diabetes mellitus (HCC)   Chronic diastolic CHF (congestive heart failure) ([place])   Rectal mass      Consultations:  Gastroenterology  Subjective: Patient seen at bedside.  Sitting up in the recliner, overall she is very upset and refusing any medical care.  Tells me her abdominal pain,  diarrhea and all of her symptoms have resolved.  She feels back to her baseline.  She does not want any further in-hospital treatment or procedures.  I have attempted to call patient's daughter Jeral Fruit multiple times but no answer therefore left a voicemail.  Discharge Exam: Vitals:   09/30/21 0400 09/30/21 0948  BP: (!) 134/50 (!) 151/54  Pulse: 64 60  Resp: 17 18  Temp: 97.8 F (36.6 C) 98 F (36.7 C)  SpO2: 95% 94%   Vitals:   09/29/21 1736 09/29/21 2028 09/30/21 0400 09/30/21 0948  BP: (!) 156/69 (!) 136/51 (!) 134/50 (!) 151/54  Pulse: 77 (!) 59 64 60  Resp: 16 18 17 18   Temp: 98.7 F (37.1 C) 98.6 F (37 C) 97.8 F (36.6 C) 98 F (36.7 C)  TempSrc:   Oral Oral  SpO2: 98% 96% 95% 94%  Weight:      Height:        Patient is refusing physical exam  Discharge Instructions   Allergies as of 09/30/2021       Reactions   Alendronate Other (See Comments)   Hand Pain Other reaction(s): Other Hand pain  Hand pain  Hand Pain Other reaction(s): Other Hand pain    Dilaudid [hydromorphone] Rash   Monosodium Glutamate Anaphylaxis   Alendronate Sodium    Levofloxacin Hives   Lisinopril Cough   Morphine And Related Hives   Pravastatin Itching   Solifenacin Other (See Comments)   unknown   Tradjenta [linagliptin]    unknown   Trospium    unknown   Augmentin [amoxicillin-pot Clavulanate]    Yeast infection   Sulfa Antibiotics Rash        Medication List     TAKE these medications    apixaban 5 MG Tabs tablet Commonly known as: ELIQUIS Take 5 mg by mouth 2 (two) times daily.   bisacodyl 5 MG EC tablet Commonly known as: DULCOLAX Take 5 mg by mouth daily as needed.   Brilinta 90 MG Tabs tablet Generic drug: ticagrelor Take 90 mg by mouth 2 (two) times daily.   bumetanide 2 MG tablet Commonly known as: BUMEX 2 mg 2 (two) times daily.   carvedilol 6.25 MG tablet Commonly known as: COREG Take 6.25 mg by mouth daily.   cephALEXin 500 MG  capsule Commonly known as: KEFLEX Take 1 capsule (500 mg total) by mouth 3 (three) times daily for 2 days.   Cholecalciferol 50 MCG (2000 UT) Caps Take 4,000 Units by mouth daily.   diclofenac Sodium 1 % Gel Commonly known as: VOLTAREN Apply 2 g topically 4 (four) times daily.   docusate sodium 100 MG capsule Commonly known as: COLACE Take 300 mg by mouth at bedtime.   DULoxetine 60 MG capsule Commonly known as: CYMBALTA Take 60 mg by mouth daily.   insulin NPH-regular Human (70-30) 100 UNIT/ML injection Inject 5-10 Units into the skin 3 (three) times daily.   losartan 50 MG tablet Commonly known as: COZAAR Take 100 mg by mouth daily.   Multi-Vitamin tablet Take 1 tablet by mouth daily.   oxyCODONE-acetaminophen 10-325 MG tablet Commonly known as: PERCOCET Take 1 tablet by mouth 3 (three) times daily as needed.        Allergies  Allergen Reactions   Alendronate Other (See Comments)    Hand  Pain Other reaction(s): Other Hand pain  Hand pain  Hand Pain Other reaction(s): Other Hand pain     Dilaudid [Hydromorphone] Rash   Monosodium Glutamate Anaphylaxis   Alendronate Sodium    Levofloxacin Hives   Lisinopril Cough   Morphine And Related Hives   Pravastatin Itching   Solifenacin Other (See Comments)    unknown   Tradjenta [Linagliptin]     unknown   Trospium     unknown   Augmentin [Amoxicillin-Pot Clavulanate]     Yeast infection   Sulfa Antibiotics Rash    You were cared for by a hospitalist during your hospital stay. If you have any questions about your discharge medications or the care you received while you were in the hospital after you are discharged, you can call the unit and asked to speak with the hospitalist on call if the hospitalist that took care of you is not available. Once you are discharged, your primary care physician will handle any further medical issues. Please note that no refills for any discharge medications will be authorized  once you are discharged, as it is imperative that you return to your primary care physician (or establish a relationship with a primary care physician if you do not have one) for your aftercare needs so that they can reassess your need for medications and monitor your lab values.   Procedures/Studies: CT ABDOMEN PELVIS W CONTRAST  Result Date: 09/28/2021 CLINICAL DATA:  Abdominal pain. Nausea, vomiting, diarrhea since 4 a.m. EXAM: CT ABDOMEN AND PELVIS WITH CONTRAST TECHNIQUE: Multidetector CT imaging of the abdomen and pelvis was performed using the standard protocol following bolus administration of intravenous contrast. RADIATION DOSE REDUCTION: This exam was performed according to the departmental dose-optimization program which includes automated exposure control, adjustment of the mA and/or kV according to patient size and/or use of iterative reconstruction technique. CONTRAST:  191mL OMNIPAQUE IOHEXOL 300 MG/ML  SOLN COMPARISON:  06/12/2013 FINDINGS: Lower chest: Scarring medially in the right middle lobe with a somewhat rounded subpleural nodular component measuring 1.2 by 0.7 cm, similar although smaller appearance measuring 1.0 by 0.4 cm on 06/12/2013. Mild dependent atelectasis in the right lower lobe. Stable 6 by 3 mm subpleural nodule in the left lower lobe on image 17 series 4. Aortic valve replacement. Right coronary artery stent. Dense mitral valve calcification. Thickened interventricular septum up to 2.1 cm suggesting left ventricular hypertrophy. 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 Cholecystectomy. Pancreas: Unremarkable Spleen: Unremarkable Adrenals/Urinary Tract: Both adrenal glands appear normal. Fluid density 2.6 by 2.2 cm cyst of the right kidney upper pole anteriorly on image 30 series 2. Nonspecific 1.0 by 1.1 cm hypodense lesion in the left mid to lower kidney on image 41 series  2, sharply defined and probably a cyst. 5 mm nonobstructive left renal calculus on image 39 series 2. No hydronephrosis or hydroureter. Urinary bladder unremarkable. Stomach/Bowel: Transverse duodenal diverticulum without inflammatory findings. The appendix is not well seen. Sigmoid diverticulosis without active diverticulitis. No dilated bowel. Mildly accentuated density in the rectum on image 73 series 2 may be incidental but I cannot exclude a rectal mass. Scattered air-fluid levels in nondilated loops of proximal jejunum are nonspecific not necessarily abnormal could reflect low-grade enteritis. Vascular/Lymphatic: Somewhat dilated celiac trunk at 1.4 cm diameter (previously 1.2 cm on 07/13/2013). Atherosclerosis is present, including aortoiliac atherosclerotic disease. There is some calcific plaque in the SMA with contrast visible in the SMA hence  no overt occlusion. Porta hepatis node mildly prominent at 1.0 cm short axis. Peripancreatic lymph node mildly prominent at 1.1 cm in short axis. Both are shown on image 27 series 2. Portacaval node 1.3 cm in short axis on image 31 series 2, within normal limits for this location. Reproductive: Uterus absent.  Adnexa unremarkable. Other: No supplemental non-categorized findings. Musculoskeletal: Small umbilical hernia contains adipose tissue, image 62 series 6. Grade 1 degenerative anterolisthesis at L5-S1. Stable small metal foreign body along the right upper anterior thoracoabdominal wall, image 20 series 2. IMPRESSION: 1. Scattered air-fluid levels in nondilated loops of proximal jejunum, nonspecific and not necessarily abnormal, but conceivably a subtle manifestation of mild proximal enteritis. 2. Accentuated density in the rectum could be from local wall thickening or rectal contents. Strictly speaking a rectal mass is not excluded. Correlation with the patient's colon cancer screening history is recommended. If screening is not up-to-date, appropriate screening  should be considered. 3. Chronic scarring in the right middle lobe, with a nodular component slightly more prominent than on 06/12/2013. Given that this was present although less prominent 9 years ago, this is most likely benign. 4. Borderline to mildly prominent porta hepatis lymph nodes, probably reactive but technically nonspecific. 5. Other imaging findings of potential clinical significance: Aortic valve replacement. Dense mitral valve calcification. Left ventricular hypertrophy. 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 5 mm nonobstructive left renal calculus. Sigmoid diverticulosis. Chronic mildly dilated celiac trunk. Atherosclerosis is present, including aortoiliac atherosclerotic disease. Small umbilical hernia contains adipose tissue. Electronically Signed   By: Van Clines M.D.   On: 09/28/2021 12:58   DG Chest Portable 1 View  Result Date: 09/28/2021 CLINICAL DATA:  Cough 1 month EXAM: PORTABLE CHEST 1 VIEW COMPARISON:  08/24/2021 FINDINGS: Heart size upper normal. Postop TAVR unchanged in position. Pulmonary vascularity normal. Mild bibasilar airspace disease.  No effusion. IMPRESSION: Mild bibasilar airspace disease may represent atelectasis or pneumonia. Electronically Signed   By: Franchot Gallo M.D.   On: 09/28/2021 11:49   US Abdomen Limited RUQ (LIVER/GB)  Result Date: 09/28/2021 CLINICAL DATA:  Abdomen pain EXAM: ULTRASOUND ABDOMEN LIMITED RIGHT UPPER QUADRANT COMPARISON:  CT 09/28/2021 FINDINGS: Gallbladder: Surgically absent Common bile duct: Diameter: 5.8 mm Liver: Echogenicity within normal limits. Dilated vascular structures in the left hepatic lobe corresponding to CT demonstrated vascular malformation. Portal vein is patent on color Doppler imaging with normal direction of blood flow towards the liver. Other: None. IMPRESSION: 1. Status post cholecystectomy without biliary dilatation 2. Redemonstrated left hepatic lobe vascular malformation  Electronically Signed   By: Donavan Foil M.D.   On: 09/28/2021 15:35     The results of significant diagnostics from this hospitalization (including imaging, microbiology, ancillary and laboratory) are listed below for reference.     Microbiology: Recent Results (from the past 240 hour(s))  Resp Panel by RT-PCR (Flu A&B, Covid) Nasopharyngeal Swab     Status: None   Collection Time: 09/28/21 12:30 PM   Specimen: Nasopharyngeal Swab; Nasopharyngeal(NP) swabs in vial transport medium  Result Value Ref Range Status   SARS Coronavirus 2 by RT PCR NEGATIVE NEGATIVE Final    Comment: (NOTE) SARS-CoV-2 target nucleic acids are NOT DETECTED.  The SARS-CoV-2 RNA is generally detectable in upper respiratory specimens during the acute phase of infection. The lowest concentration of SARS-CoV-2 viral copies this assay can detect is 138 copies/mL. A negative result does not preclude SARS-Cov-2 infection and should not be used as the sole basis for treatment  or other patient management decisions. A negative result may occur with  improper specimen collection/handling, submission of specimen other than nasopharyngeal swab, presence of viral mutation(s) within the areas targeted by this assay, and inadequate number of viral copies(<138 copies/mL). A negative result must be combined with clinical observations, patient history, and epidemiological information. The expected result is Negative.  Fact Sheet for Patients:  EntrepreneurPulse.com.au  Fact Sheet for Healthcare Providers:  IncredibleEmployment.be  This test is no t yet approved or cleared by the [place] FDA and  has been authorized for detection and/or diagnosis of SARS-CoV-2 by FDA under an Emergency Use Authorization (EUA). This EUA will remain  in effect (meaning this test can be used) for the duration of the COVID-19 declaration under Section 564(b)(1) of the Act, 21 U.S.C.section  360bbb-3(b)(1), unless the authorization is terminated  or revoked sooner.       Influenza A by PCR NEGATIVE NEGATIVE Final   Influenza B by PCR NEGATIVE NEGATIVE Final    Comment: (NOTE) The Xpert Xpress SARS-CoV-2/FLU/RSV plus assay is intended as an aid in the diagnosis of influenza from Nasopharyngeal swab specimens and should not be used as a sole basis for treatment. Nasal washings and aspirates are unacceptable for Xpert Xpress SARS-CoV-2/FLU/RSV testing.  Fact Sheet for Patients: EntrepreneurPulse.com.au  Fact Sheet for Healthcare Providers: IncredibleEmployment.be  This test is not yet approved or cleared by the [place] FDA and has been authorized for detection and/or diagnosis of SARS-CoV-2 by FDA under an Emergency Use Authorization (EUA). This EUA will remain in effect (meaning this test can be used) for the duration of the COVID-19 declaration under Section 564(b)(1) of the Act, 21 U.S.C. section 360bbb-3(b)(1), unless the authorization is terminated or revoked.  Performed at [facility], [address] [place], [place] [address]      Labs: BNP (last 3 results) Recent Labs    11/02/20 0027 01/25/21 1728  BNP 603.2* [id number]*   Basic Metabolic Panel: Recent Labs  Lab 09/28/21 1039 09/29/21 0416 09/30/21 0444  NA 138 135 138  K 4.4 3.8 3.5  CL 104 104 106  CO2 28 28 28   GLUCOSE 154* 116* 123*  BUN 23 23 16   CREATININE 0.75 0.83 0.72  CALCIUM 9.3 8.3* 8.5*   Liver Function Tests: Recent Labs  Lab 09/28/21 1039 09/29/21 0416 09/30/21 0444  AST 60* 36 37  ALT 55* 36 33  ALKPHOS 216* 154* 141*  BILITOT 0.9 0.9 0.5  PROT 7.6 6.1* 6.0*  ALBUMIN 3.4* 2.7* 2.7*   Recent Labs  Lab 09/28/21 1039 09/29/21 0416 09/30/21 0444  LIPASE 239* 51 36   No results for input(s): AMMONIA in the last 168 hours. CBC: Recent Labs  Lab 09/28/21 1039 09/29/21 0416 09/30/21 0444  WBC 11.5* 7.1 5.7   HGB 14.1 11.7* 11.7*  HCT 44.5 36.5 36.9  MCV 95.7 95.5 96.1  PLT 124* 109* 98*   Cardiac Enzymes: No results for input(s): CKTOTAL, CKMB, CKMBINDEX, TROPONINI in the last 168 hours. BNP: Invalid input(s): POCBNP CBG: Recent Labs  Lab 09/29/21 0817 09/29/21 1203 09/29/21 1733 09/29/21 2114 09/30/21 0935  GLUCAP 91 143* 143* 106* 128*   D-Dimer No results for input(s): DDIMER in the last 72 hours. Hgb A1c Recent Labs    09/28/21 1039  HGBA1C 5.9*   Lipid Profile No results for input(s): CHOL, HDL, LDLCALC, TRIG, CHOLHDL, LDLDIRECT in the last 72 hours. Thyroid function studies No results for input(s): TSH, T4TOTAL, T3FREE, THYROIDAB in the last  72 hours.  Invalid input(s): FREET3 Anemia work up No results for input(s): VITAMINB12, FOLATE, FERRITIN, TIBC, IRON, RETICCTPCT in the last 72 hours. Urinalysis    Component Value Date/Time   COLORURINE YELLOW (A) 09/29/2021 0735   APPEARANCEUR HAZY (A) 09/29/2021 0735   LABSPEC 1.018 09/29/2021 0735   PHURINE 5.0 09/29/2021 0735   GLUCOSEU NEGATIVE 09/29/2021 0735   HGBUR NEGATIVE 09/29/2021 0735   BILIRUBINUR NEGATIVE 09/29/2021 0735   KETONESUR NEGATIVE 09/29/2021 0735   PROTEINUR NEGATIVE 09/29/2021 0735   NITRITE POSITIVE (A) 09/29/2021 0735   LEUKOCYTESUR MODERATE (A) 09/29/2021 0735   Sepsis Labs Invalid input(s): PROCALCITONIN,  WBC,  LACTICIDVEN Microbiology Recent Results (from the past 240 hour(s))  Resp Panel by RT-PCR (Flu A&B, Covid) Nasopharyngeal Swab     Status: None   Collection Time: 09/28/21 12:30 PM   Specimen: Nasopharyngeal Swab; Nasopharyngeal(NP) swabs in vial transport medium  Result Value Ref Range Status   SARS Coronavirus 2 by RT PCR NEGATIVE NEGATIVE Final    Comment: (NOTE) SARS-CoV-2 target nucleic acids are NOT DETECTED.  The SARS-CoV-2 RNA is generally detectable in upper respiratory specimens during the acute phase of infection. The lowest concentration of SARS-CoV-2 viral  copies this assay can detect is 138 copies/mL. A negative result does not preclude SARS-Cov-2 infection and should not be used as the sole basis for treatment or other patient management decisions. A negative result may occur with  improper specimen collection/handling, submission of specimen other than nasopharyngeal swab, presence of viral mutation(s) within the areas targeted by this assay, and inadequate number of viral copies(<138 copies/mL). A negative result must be combined with clinical observations, patient history, and epidemiological information. The expected result is Negative.  Fact Sheet for Patients:  EntrepreneurPulse.com.au  Fact Sheet for Healthcare Providers:  IncredibleEmployment.be  This test is no t yet approved or cleared by the [place] FDA and  has been authorized for detection and/or diagnosis of SARS-CoV-2 by FDA under an Emergency Use Authorization (EUA). This EUA will remain  in effect (meaning this test can be used) for the duration of the COVID-19 declaration under Section 564(b)(1) of the Act, 21 U.S.C.section 360bbb-3(b)(1), unless the authorization is terminated  or revoked sooner.       Influenza A by PCR NEGATIVE NEGATIVE Final   Influenza B by PCR NEGATIVE NEGATIVE Final    Comment: (NOTE) The Xpert Xpress SARS-CoV-2/FLU/RSV plus assay is intended as an aid in the diagnosis of influenza from Nasopharyngeal swab specimens and should not be used as a sole basis for treatment. Nasal washings and aspirates are unacceptable for Xpert Xpress SARS-CoV-2/FLU/RSV testing.  Fact Sheet for Patients: EntrepreneurPulse.com.au  Fact Sheet for Healthcare Providers: IncredibleEmployment.be  This test is not yet approved or cleared by the [place] FDA and has been authorized for detection and/or diagnosis of SARS-CoV-2 by FDA under an Emergency Use Authorization (EUA). This  EUA will remain in effect (meaning this test can be used) for the duration of the COVID-19 declaration under Section 564(b)(1) of the Act, 21 U.S.C. section 360bbb-3(b)(1), unless the authorization is terminated or revoked.  Performed at [facility], [address] [place], [place] [address]      Time coordinating discharge:  I have spent 35 minutes face to face with the patient and on the ward discussing the patients care, assessment, plan and disposition with other care givers. >50% of the time was devoted counseling the patient about the risks and benefits of treatment/Discharge disposition and coordinating care.  SIGNED:   Damita Lack, MD  [facility] 09/30/2021, 11:28 AM   If 7PM-7AM, please contact night-coverage

## 2021-09-30 NOTE — Evaluation (Signed)
Occupational Therapy Evaluation Patient Details Name: Alexandria Goodman MRN: [id number] DOB: 16-Jul-1936 Today's Date: 09/30/2021   History of Present Illness 86 year old with history of morbid obesity, HTN, diastolic CHF, aortic valve stenosis, DM2, cholecystectomy to the ED with complaints of nausea vomiting diarrhea which started around 4 AM on the day of admission along with generalized abdominal pain.  Patient does have several sick family members with similar symptoms.  Upon admission she was found to have some transaminitis with CT scan showing concerns of gastroenteritis and rectal mass.   Clinical Impression   Patient presenting with decreased Ind in self care, balance, functional mobility/transfers, endurance, and safety awareness. Patient reports being independent at baseline and living with daughter. She has a rollator but does not use.Patient currently functioning at supervision - min guard without use of AD for functional mobility and self care tasks. Patient will benefit from acute OT to increase overall independence in the areas of ADLs, functional mobility, and safety awareness in order to safely discharge home.       Recommendations for follow up therapy are one component of a multi-disciplinary discharge planning process, led by the attending physician.  Recommendations may be updated based on patient status, additional functional criteria and insurance authorization.   Follow Up Recommendations  Home health OT    Assistance Recommended at Discharge Intermittent Supervision/Assistance  Patient can return home with the following A little help with walking and/or transfers;A little help with bathing/dressing/bathroom;Help with stairs or ramp for entrance    Functional Status Assessment  Patient has had a recent decline in their functional status and demonstrates the ability to make significant improvements in function in a reasonable and predictable amount of time.  Equipment  Recommendations  None recommended by OT       Precautions / Restrictions Precautions Precautions: Fall      Mobility Bed Mobility               General bed mobility comments: seated in recliner chair    Transfers Overall transfer level: Needs assistance Equipment used: None Transfers: Sit to/from Stand, Bed to chair/wheelchair/BSC Sit to Stand: Supervision     Step pivot transfers: Min guard, Supervision            Balance Overall balance assessment: Modified Independent                                         ADL either performed or assessed with clinical judgement   ADL Overall ADL's : Needs assistance/impaired                         Toilet Transfer: Min Pension scheme manager Details (indicate cue type and reason): simulated         Functional mobility during ADLs: Min guard;Supervision/safety       Vision Patient Visual Report: No change from baseline              Pertinent Vitals/Pain Pain Assessment Pain Assessment: Faces Faces Pain Scale: No hurt     Hand Dominance Right   Extremity/Trunk Assessment Upper Extremity Assessment Upper Extremity Assessment: Generalized weakness   Lower Extremity Assessment Lower Extremity Assessment: Generalized weakness       Communication Communication Communication: No difficulties   Cognition Arousal/Alertness: Awake/alert Behavior During Therapy: WFL for tasks assessed/performed Overall Cognitive Status: Within Functional Limits for tasks assessed  Home Living Family/patient expects to be discharged to:: Unsure Living Arrangements: Children                               Additional Comments: pt lives with daughter and multiple other family members but aparently working on finding a senior living set up or similar as she states they are being evicted in a month or 2.      Prior  Functioning/Environment Prior Level of Function : Independent/Modified Independent             Mobility Comments: Pt reports she has a rollator if she needs it but typically is able to be active and independent w/o          OT Problem List: Decreased strength;Decreased activity tolerance;Impaired balance (sitting and/or standing);Decreased safety awareness      OT Treatment/Interventions: Self-care/ADL training;Therapeutic exercise;Therapeutic activities;Energy conservation;DME and/or AE instruction;Patient/family education;Balance training    OT Goals(Current goals can be found in the care plan section) Acute Rehab OT Goals Patient Stated Goal: to get better OT Goal Formulation: With patient Time For Goal Achievement: 10/14/21 Potential to Achieve Goals: Fair ADL Goals Pt Will Perform Grooming: with modified independence;standing Pt Will Perform Lower Body Dressing: with modified independence;sit to/from stand Pt Will Transfer to Toilet: with modified independence;ambulating Pt Will Perform Toileting - Clothing Manipulation and hygiene: with modified independence;sit to/from stand  OT Frequency: Min 2X/week       AM-PAC OT "6 Clicks" Daily Activity     Outcome Measure Help from another person eating meals?: None Help from another person taking care of personal grooming?: A Little Help from another person toileting, which includes using toliet, bedpan, or urinal?: A Little Help from another person bathing (including washing, rinsing, drying)?: A Little Help from another person to put on and taking off regular upper body clothing?: None Help from another person to put on and taking off regular lower body clothing?: A Little 6 Click Score: 20   End of Session Nurse Communication: Mobility status  Activity Tolerance: Patient tolerated treatment well Patient left: in chair;with call bell/phone within reach;with chair alarm set  OT Visit Diagnosis: Unsteadiness on feet  (R26.81);Repeated falls (R29.6);Muscle weakness (generalized) (M62.81)                Time: 1194-1740 OT Time Calculation (min): 17 min Charges:  OT General Charges $OT Visit: 1 Visit OT Evaluation $OT Eval Moderate Complexity: 1 Mod OT Treatments $Therapeutic Activity: 8-22 mins  Jackquline Denmark, MS, OTR/L , CBIS ascom [phone]  09/30/21, 1:23 PM

## 2021-10-01 LAB — URINE CULTURE: Culture: >=100000 — AB

## 2022-01-22 IMAGING — CT CT CERVICAL SPINE W/O CM
3 of 4 series · 14 of 33 positions shown, 17 images · non-contrast
Comparison: None.

CLINICAL DATA: Status post fall.

EXAM:
CT CERVICAL SPINE WITHOUT CONTRAST
TECHNIQUE: Multidetector CT imaging of the cervical spine was performed without
intravenous contrast. Multiplanar CT image reconstructions were also
generated.

[Series 4: sagittal bone · sagittal · 0.27mm/px · 5 of 71 slices shown, 6 images]
[im 24/71  bone]
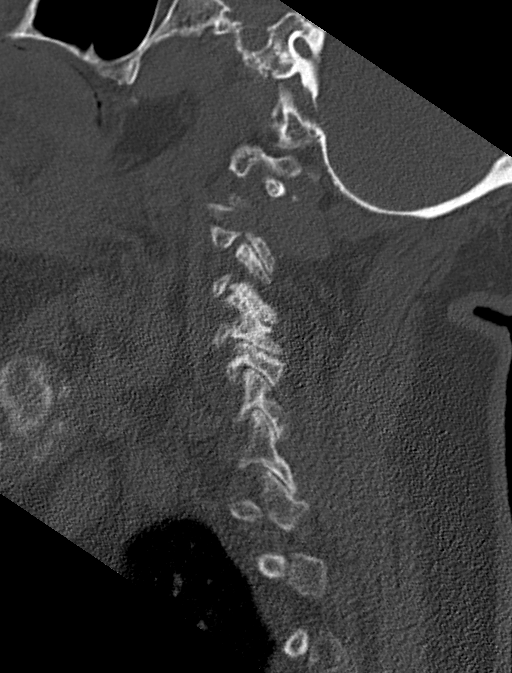
[im 30/71  bone]
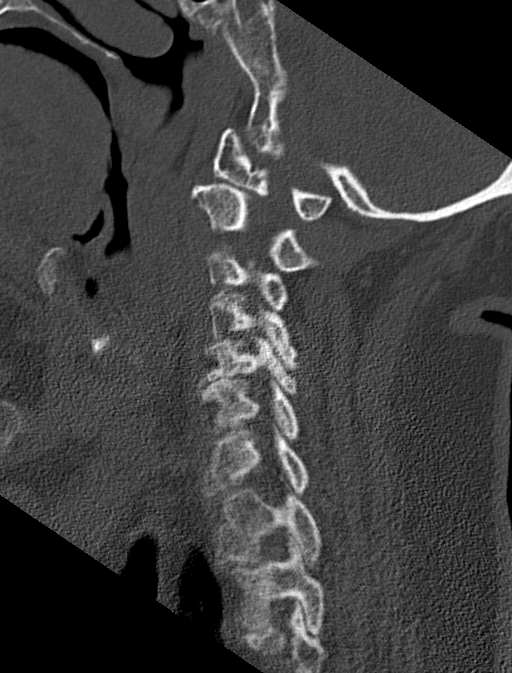
[im 36/71  soft-tissue]
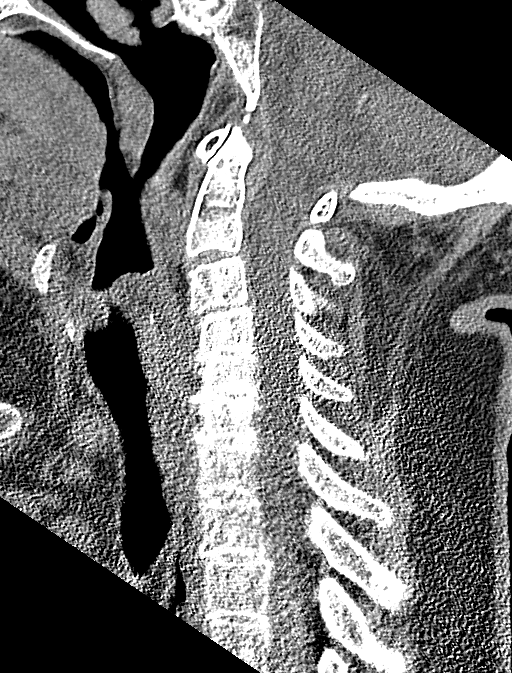
[im 36/71  bone]
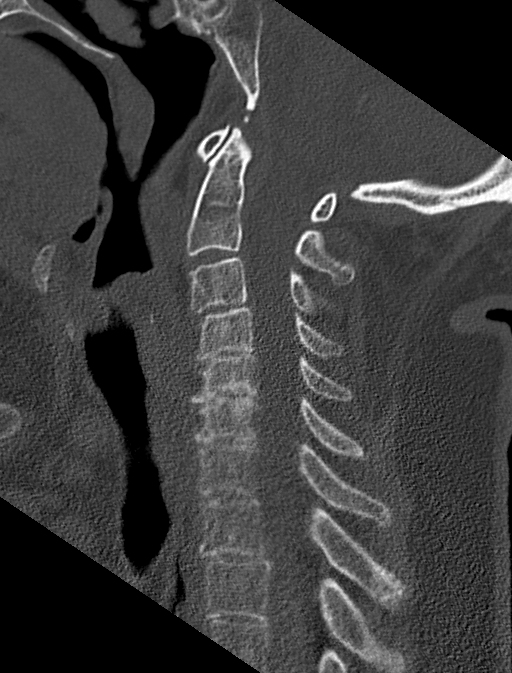
[im 41/71  bone]
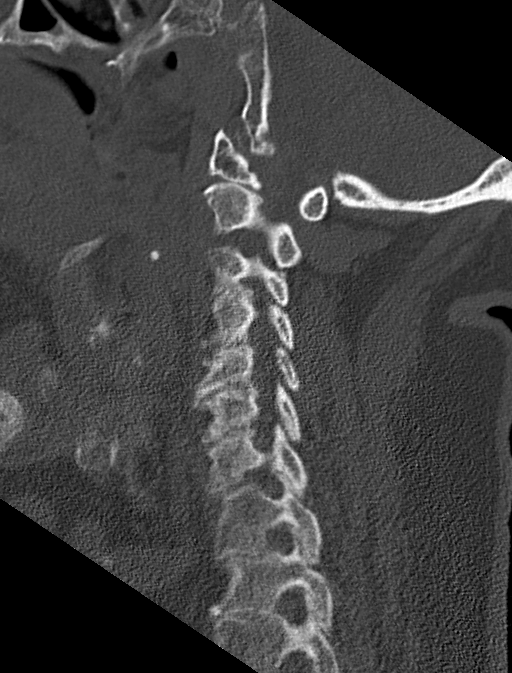
[im 47/71  bone]
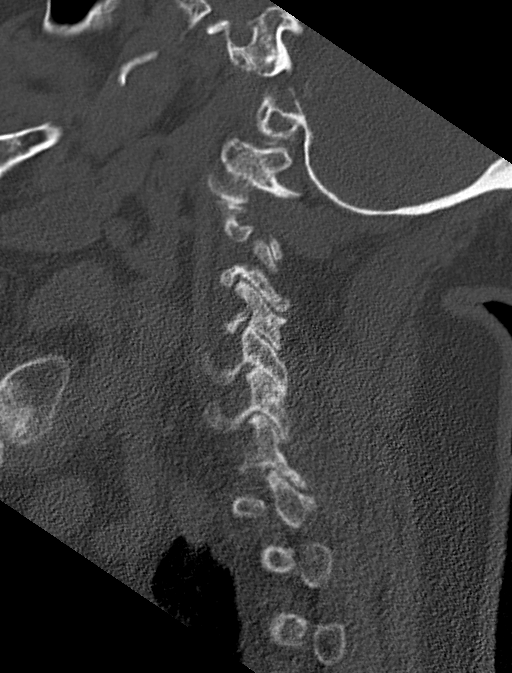

[Series 5: coronal bone · coronal · 0.28mm/px · 3 of 63 slices shown]
[im 18/63  bone]
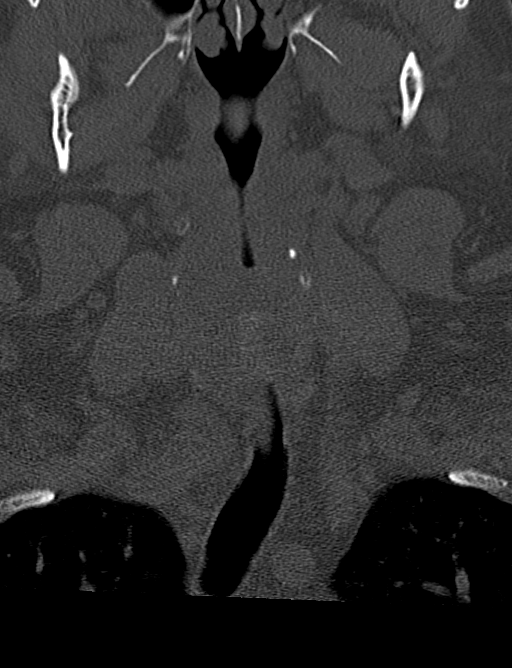
[im 27/63  bone]
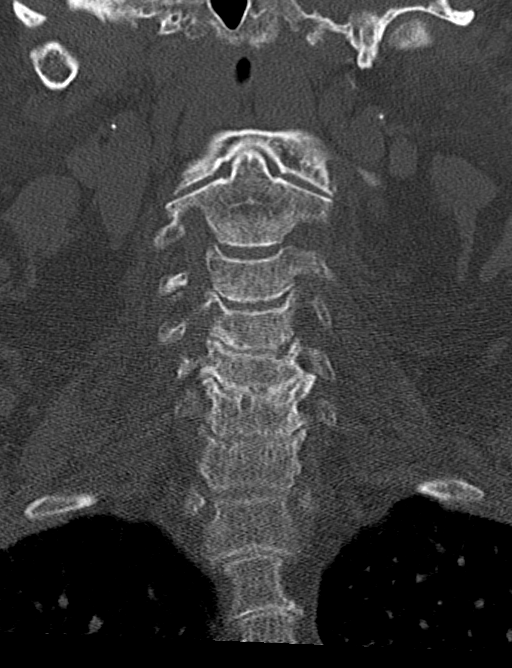
[im 36/63  bone]
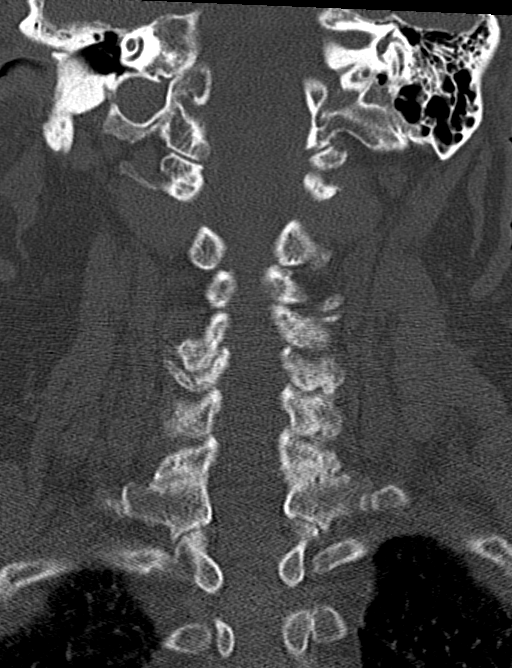

[Series 6: orthogonal bone · axial · 0.27mm/px · z∈[-313,-205]mm · 6 of 93 slices shown, 8 images]
[im 14/93  soft-tissue]
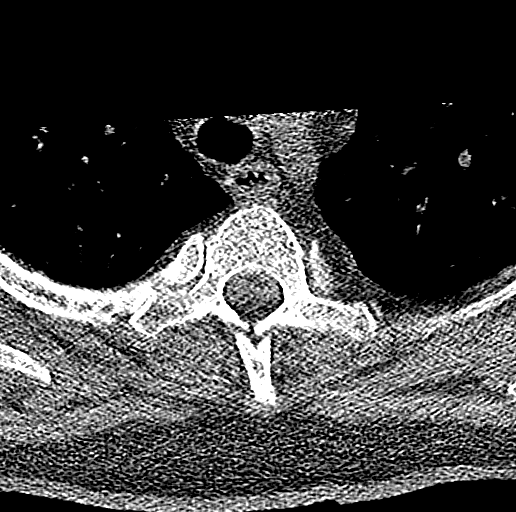
[im 14/93  bone]
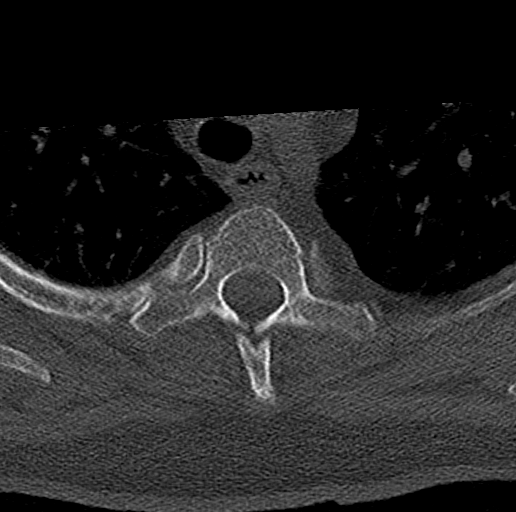
[im 27/93  bone]
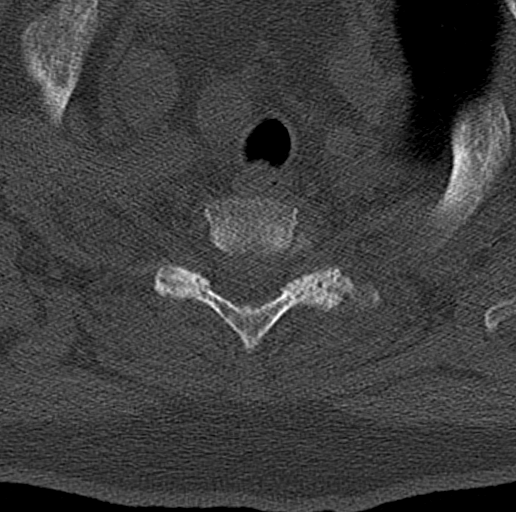
[im 40/93  bone]
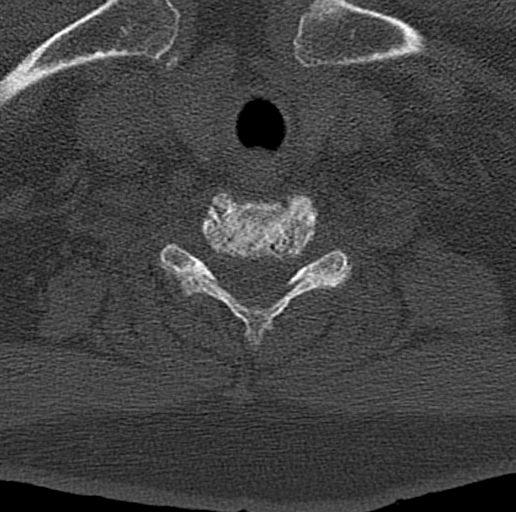
[im 53/93  bone]
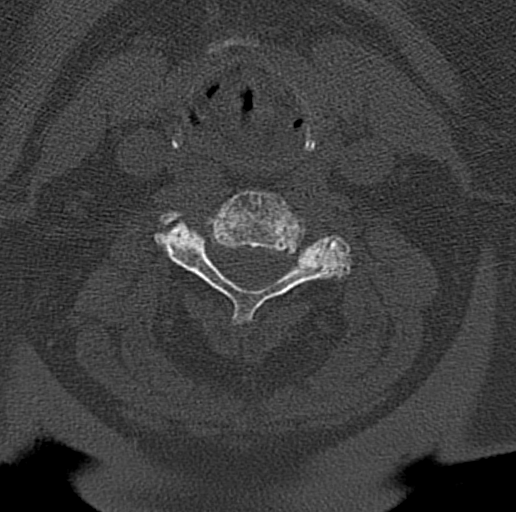
[im 66/93  soft-tissue]
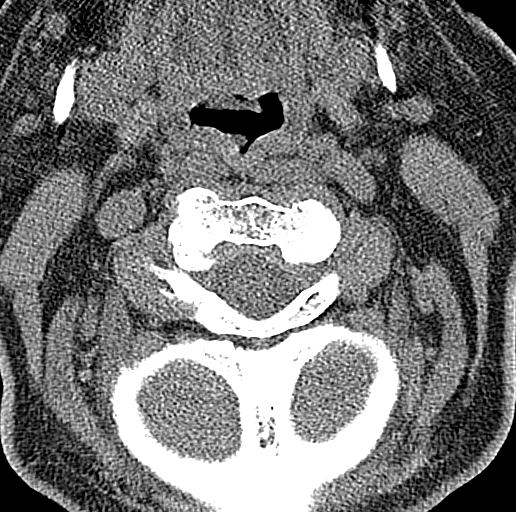
[im 66/93  bone]
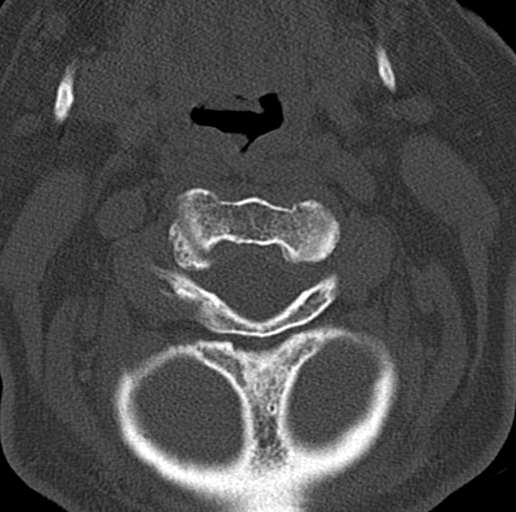
[im 79/93  bone]
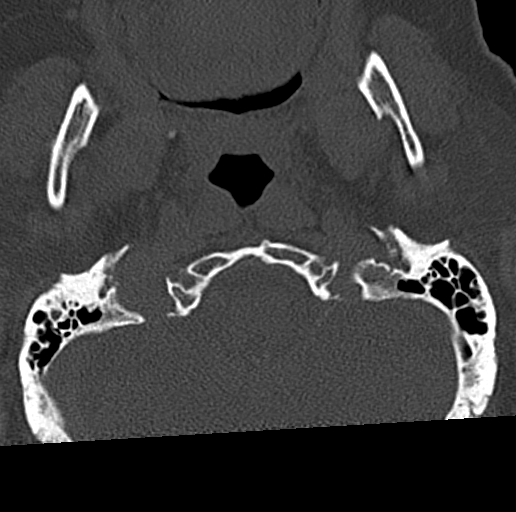

[14 of 33 positions shown; findings below may reference images not displayed]

FINDINGS: Alignment: Normal.

Skull base and vertebrae: No acute fracture. No primary bone lesion
or focal pathologic process.

Soft tissues and spinal canal: No prevertebral fluid or swelling. No
visible canal hematoma.

Disc levels: Moderate to marked severity endplate sclerosis is seen
at the levels of C4-C5, C5-C6 and C6-C7. Mild endplate sclerosis is
seen at the levels of C3-C4 and C7-T1.

There is moderate severity narrowing of the anterior atlantoaxial
articulation. Marked severity intervertebral disc space narrowing is
seen at the levels of C3-C4, C4-C5, C5-C6 and C6-C7. Moderate
severity intervertebral disc space narrowing is seen at the level of
C7-T1.

Bilateral moderate severity multilevel facet joint hypertrophy is
noted.

Upper chest: Negative.

Other: None.
IMPRESSION: 1. Marked severity multilevel degenerative changes, as described
above.
2. No evidence of an acute fracture or subluxation.

## 2022-01-22 IMAGING — CR DG HAND COMPLETE 3+V*R*
1 series · 3 of 3 positions shown · non-contrast
Comparison: None.

CLINICAL DATA: Recent fall with hand pain, initial encounter

EXAM:
RIGHT HAND - COMPLETE 3+ VIEW

[Series 1: dg hand complete right · 0.14mm/px · 3 of 3 slices shown]
[im 1/3]
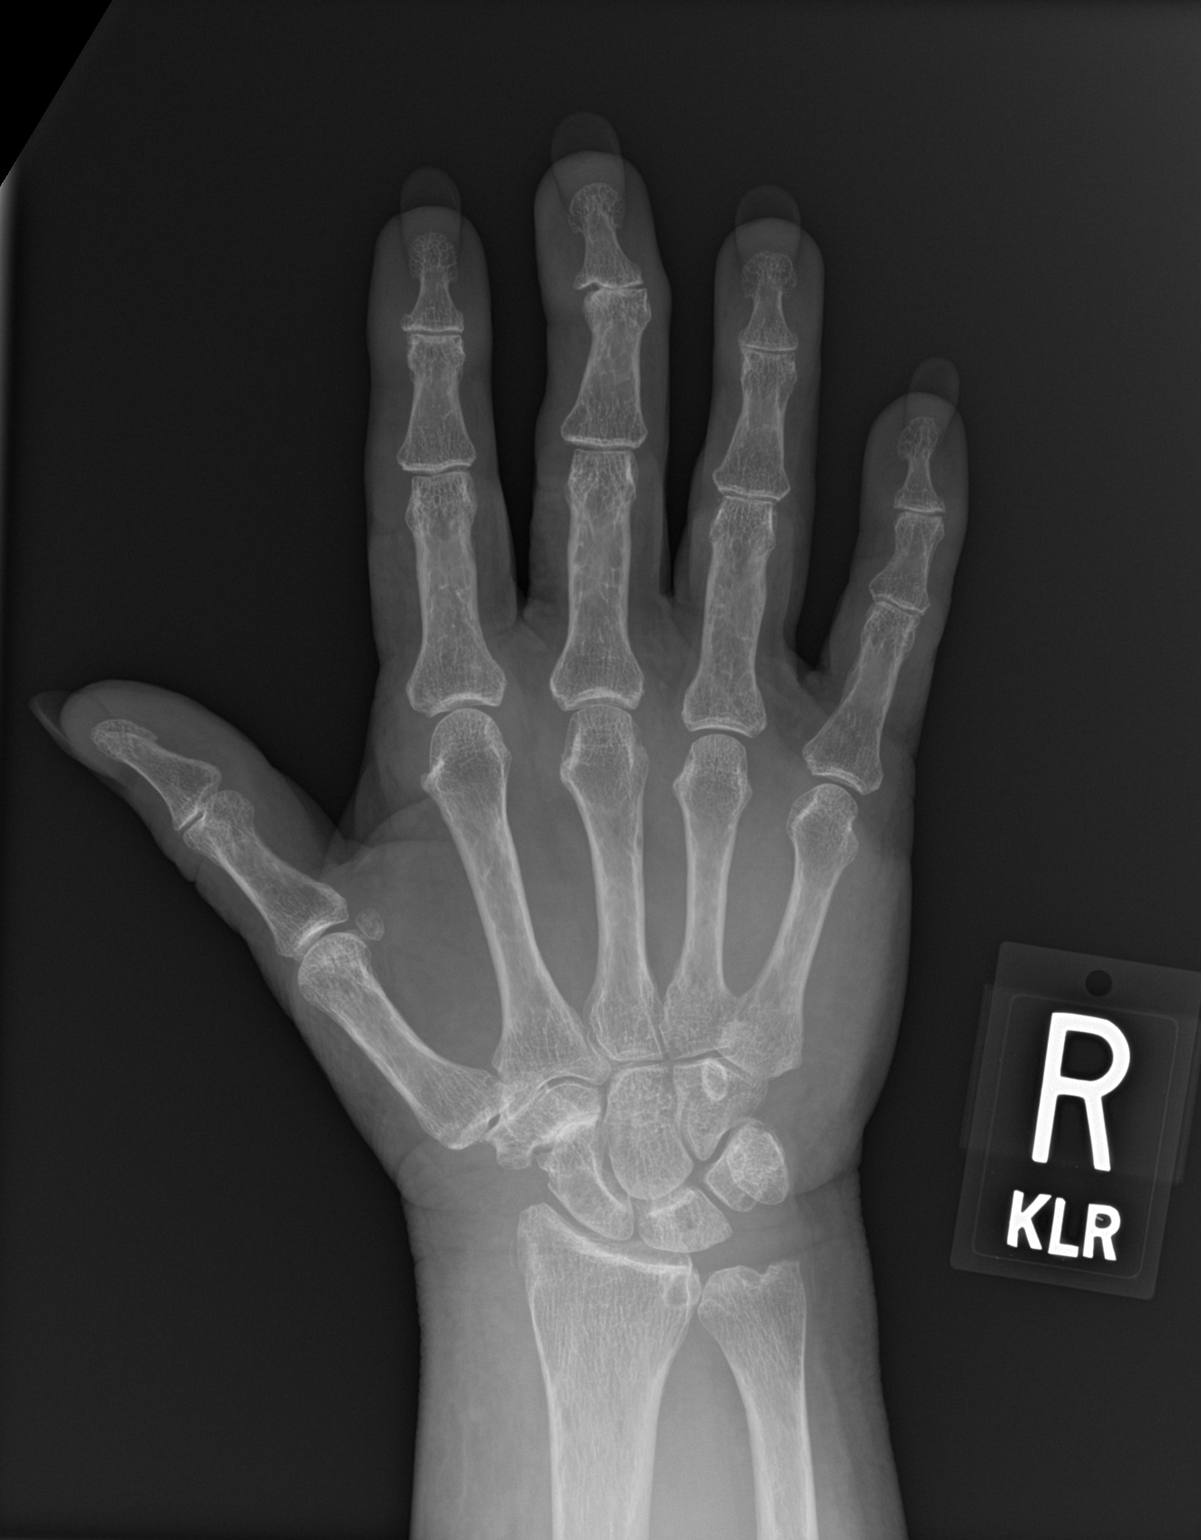
[im 2/3]
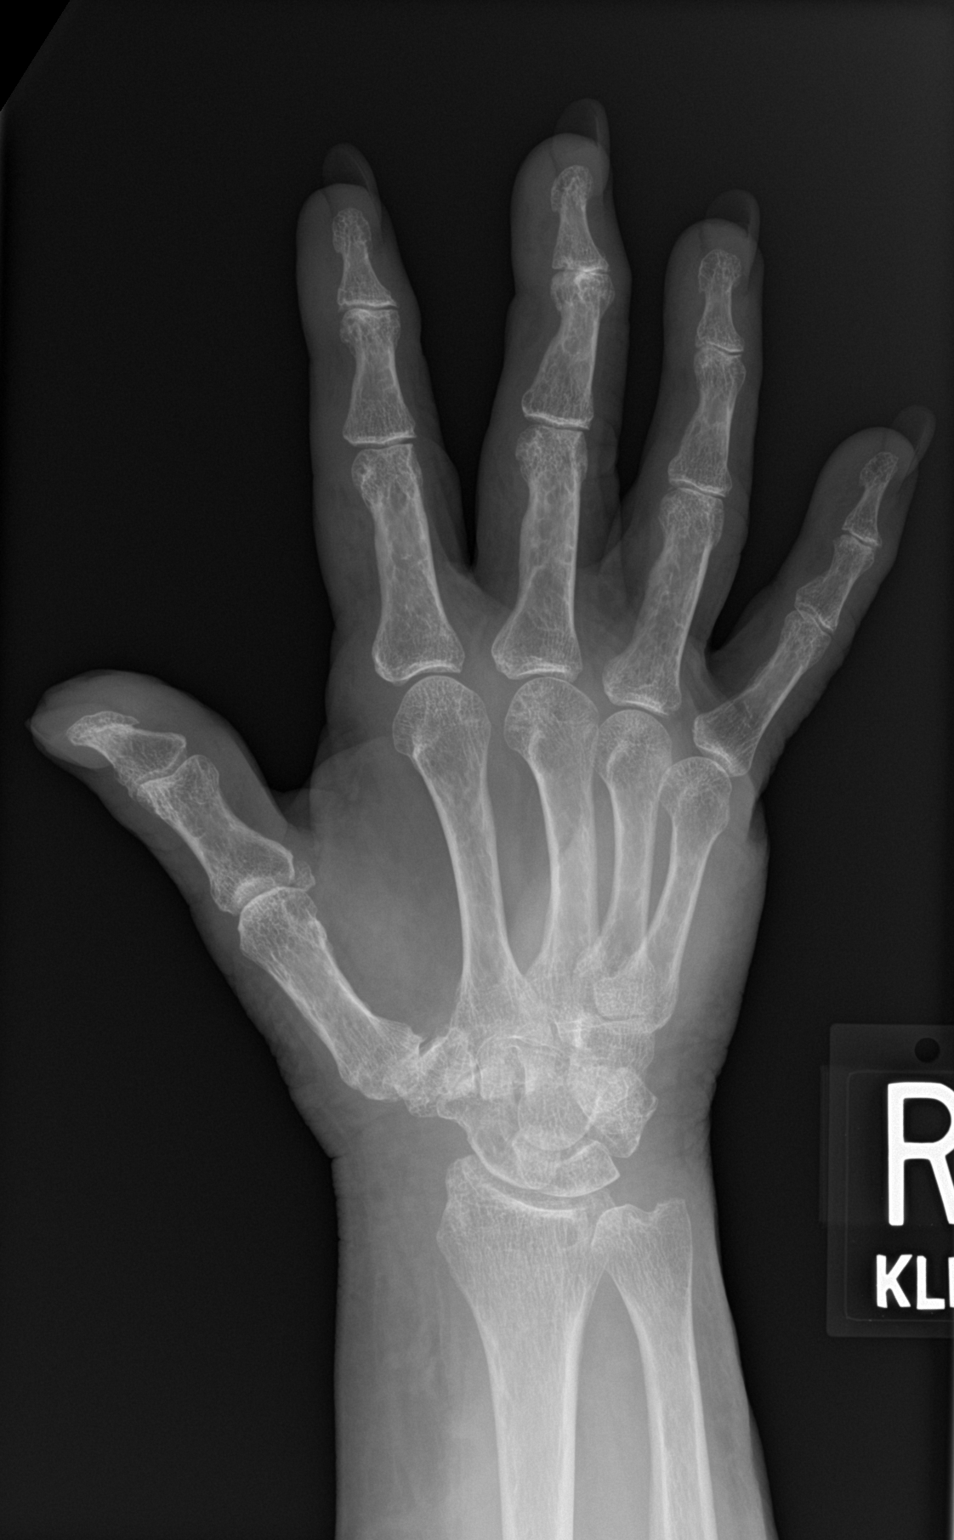
[im 3/3]
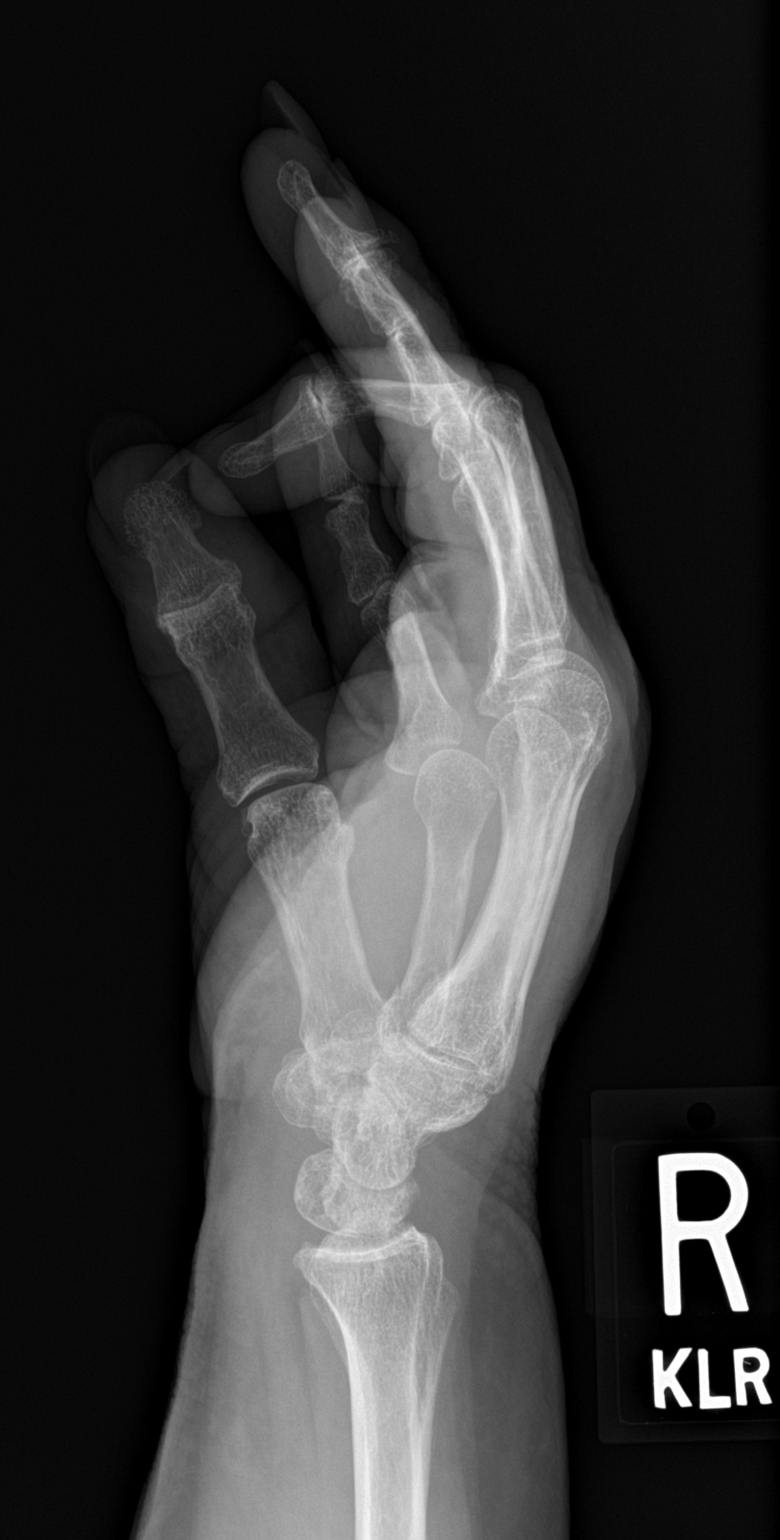

[3 of 3 positions shown; findings below may reference images not displayed]

FINDINGS: Mild degenerative changes are noted in the carpal bones.
Additionally interphalangeal degenerative changes are seen. No acute
fracture or dislocation is noted. No soft tissue abnormality is
seen.
IMPRESSION: Mild degenerative change without acute abnormality.

## 2022-01-22 IMAGING — CR DG RIBS W/ CHEST 3+V*R*
1 series · 3 of 3 positions shown · non-contrast
Comparison: 09/06/2020

CLINICAL DATA: Recent fall with right-sided rib pain, initial
encounter

EXAM:
RIGHT RIBS AND CHEST - 3+ VIEW

[Series 1: dg ribs unilateral w/chest right · 0.14mm/px · 3 of 3 slices shown]
[im 1/3]
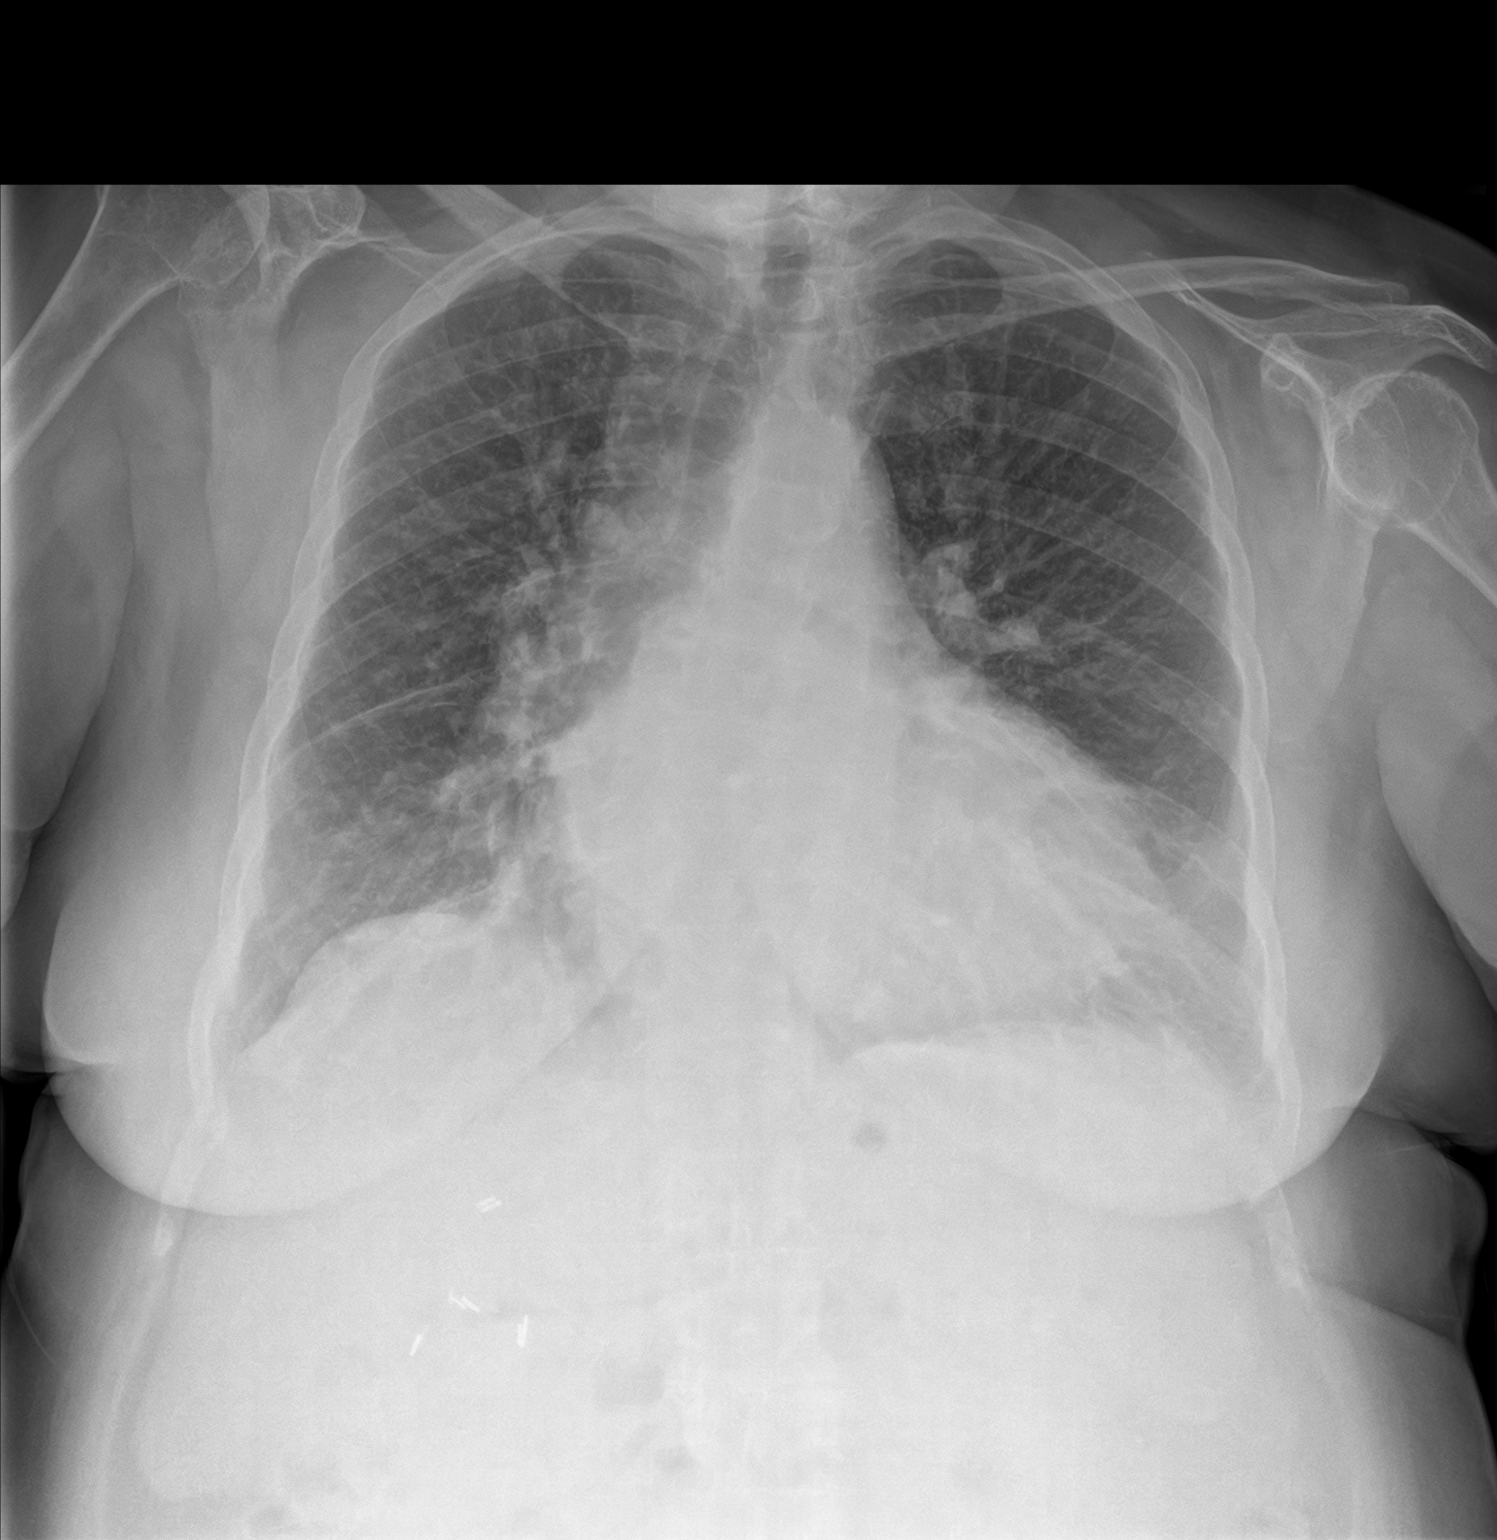
[im 2/3]
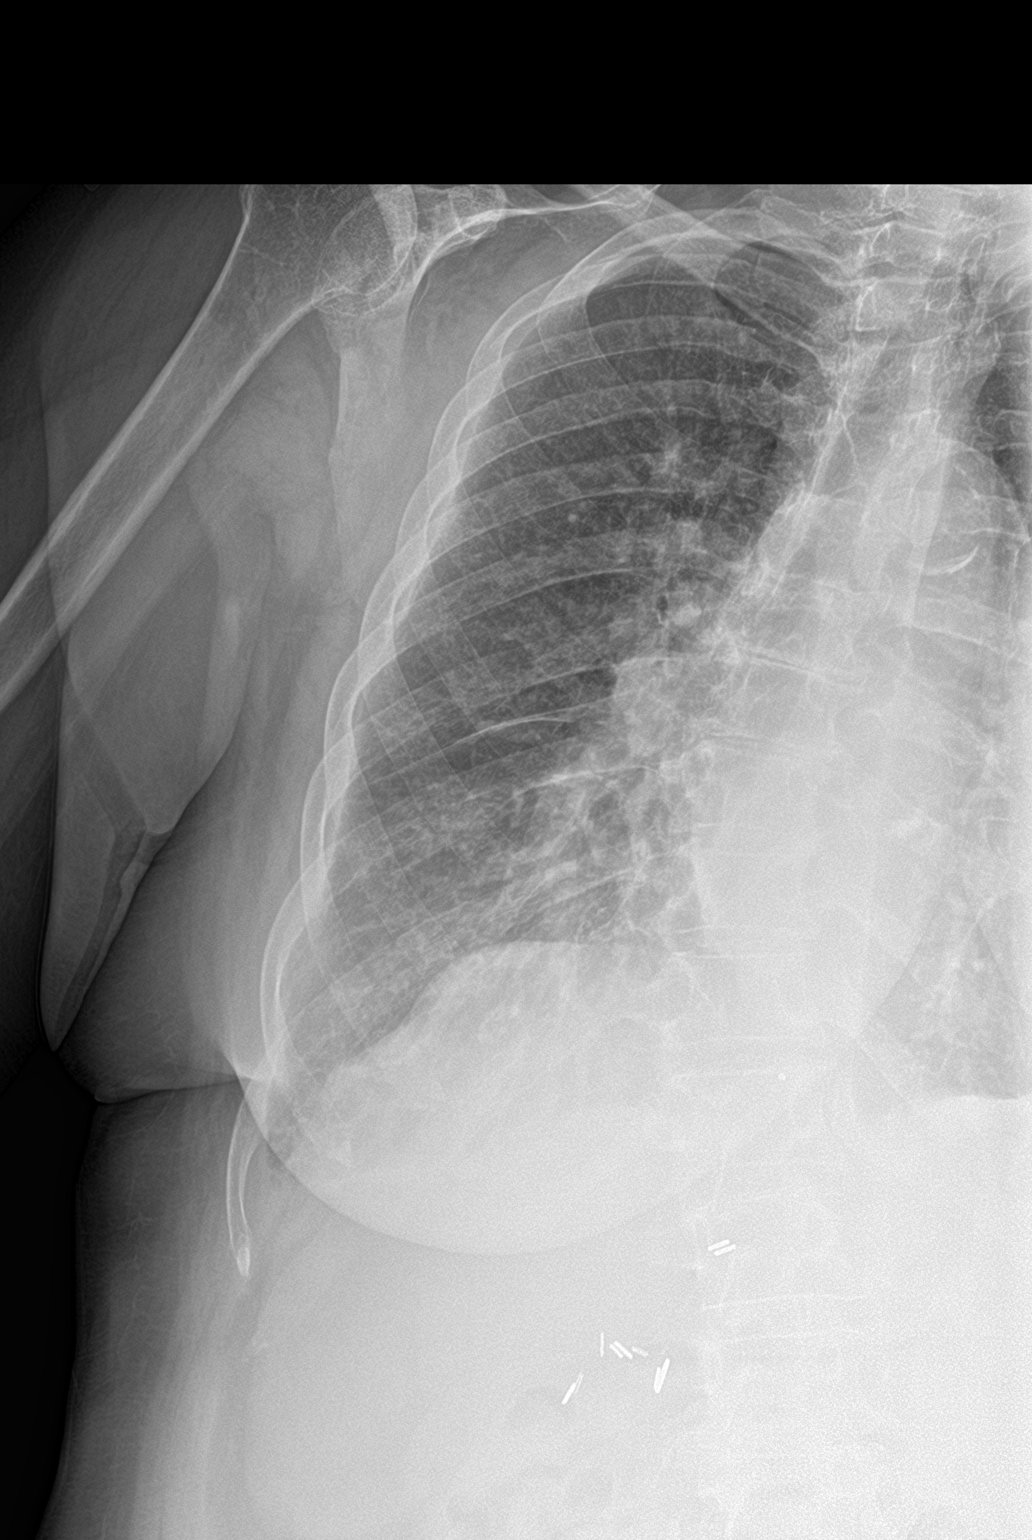
[im 3/3]
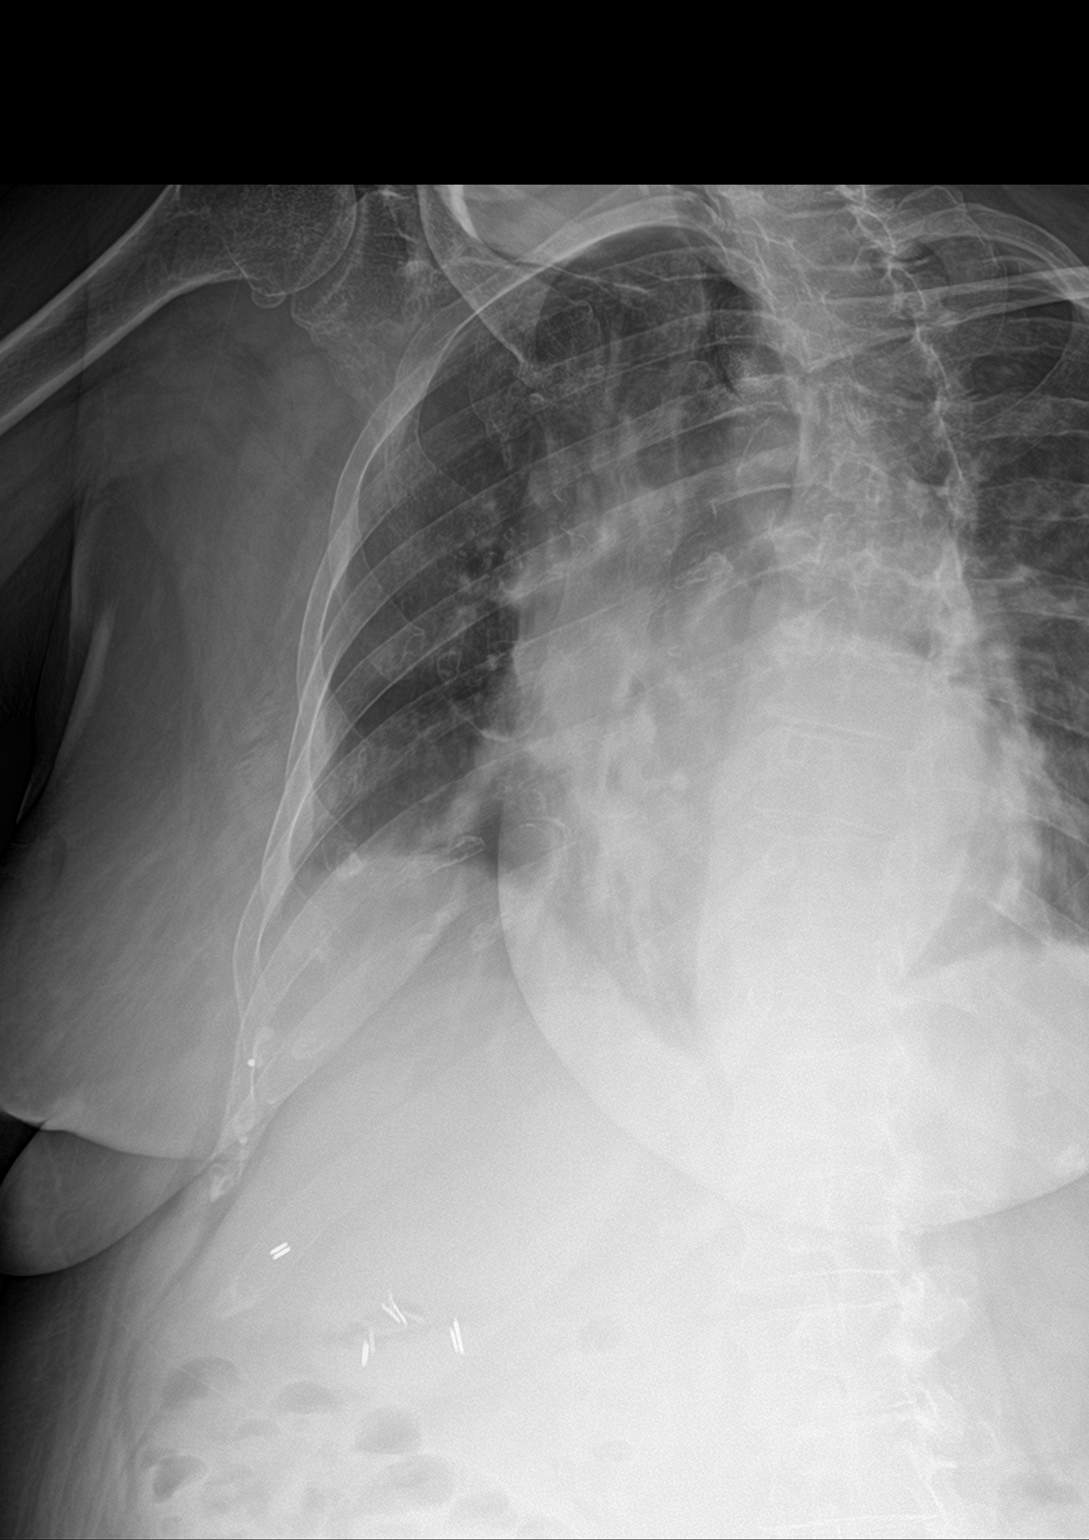

[3 of 3 positions shown; findings below may reference images not displayed]

FINDINGS: Cardiac shadow is enlarged but stable. Aortic calcifications are
again seen. The lungs are clear without focal infiltrate or sizable
effusion. No acute fracture is identified.
IMPRESSION: No evidence of acute right rib fracture.

## 2022-05-01 IMAGING — US US CAROTID DUPLEX BILAT
1 series · 14 of 24 positions shown · non-contrast
Comparison: None.

CLINICAL DATA: Syncope

EXAM:
BILATERAL CAROTID DUPLEX ULTRASOUND
TECHNIQUE: Gray scale imaging, color Doppler and duplex ultrasound were
performed of bilateral carotid and vertebral arteries in the neck.

[Series 1: us carotid bilateral · 14 of 62 slices shown]
[im 1/62]
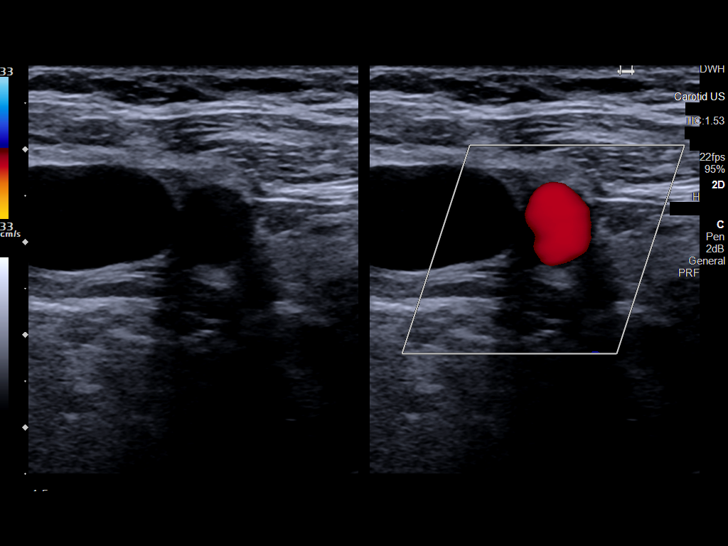
[im 6/62]
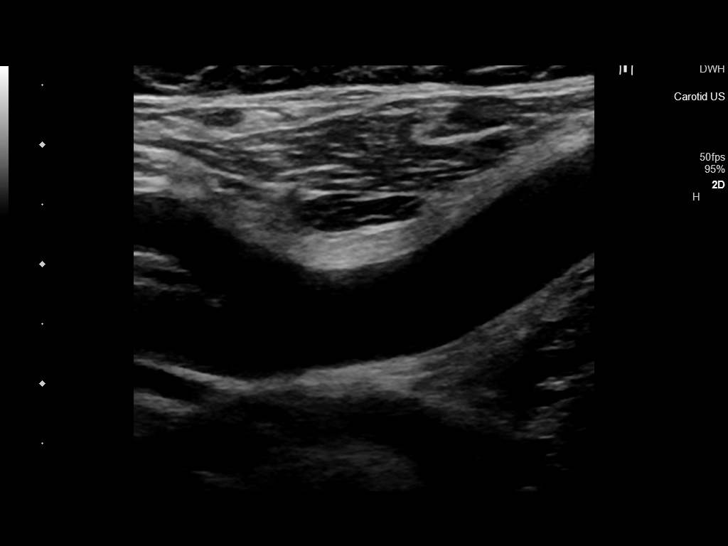
[im 11/62]
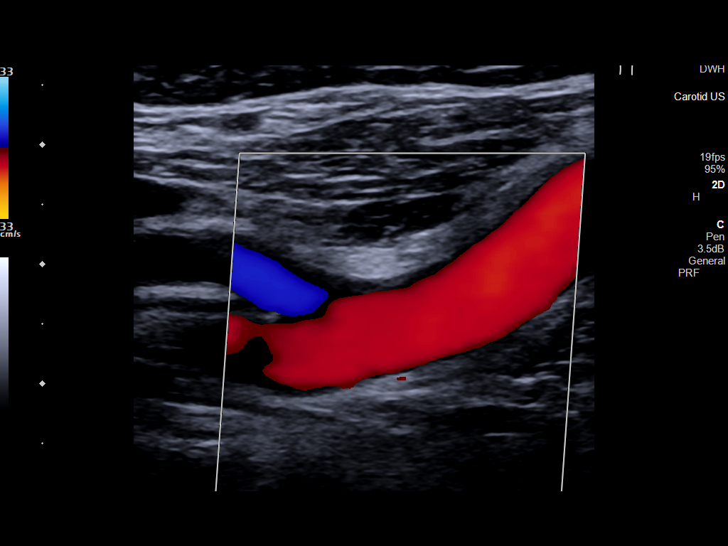
[im 16/62]
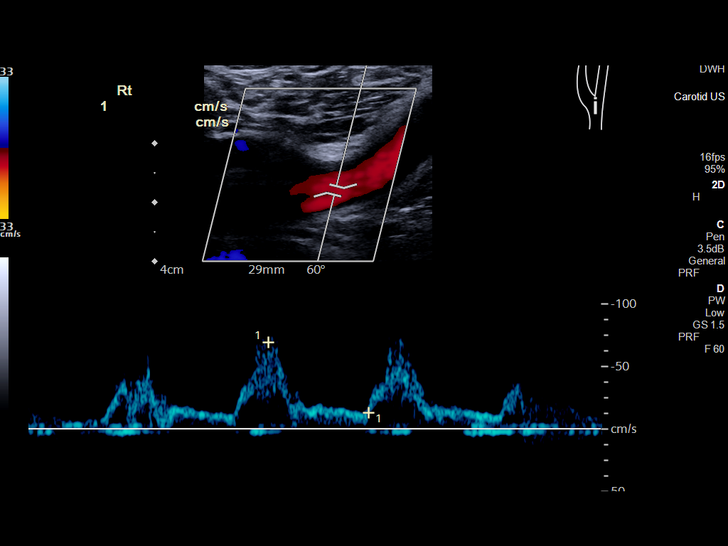
[im 19/62]
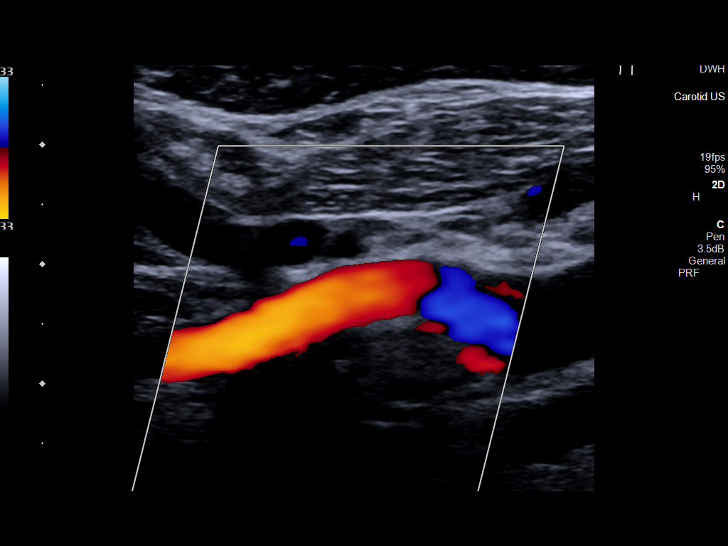
[im 24/62]
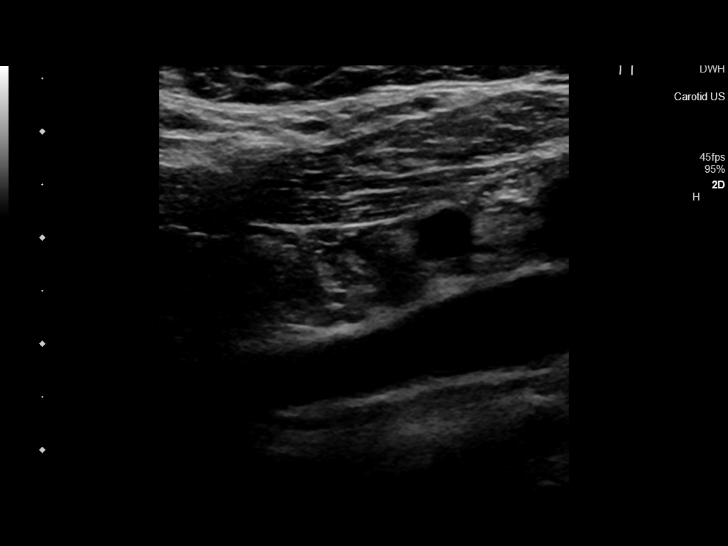
[im 30/62]
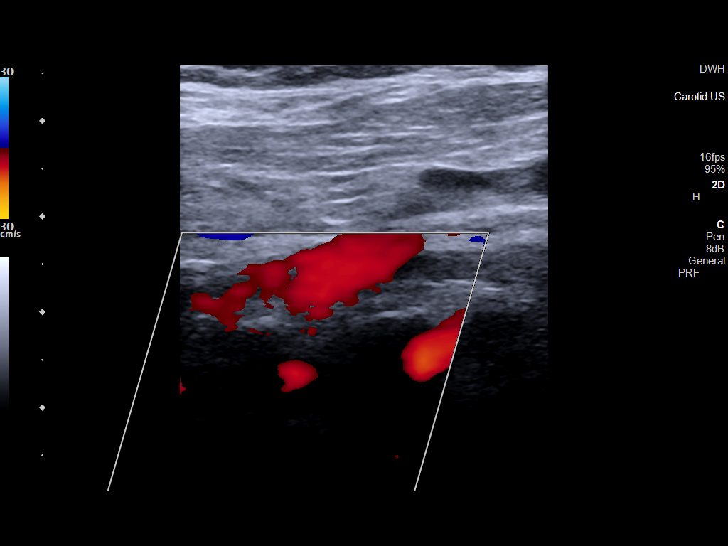
[im 32/62]
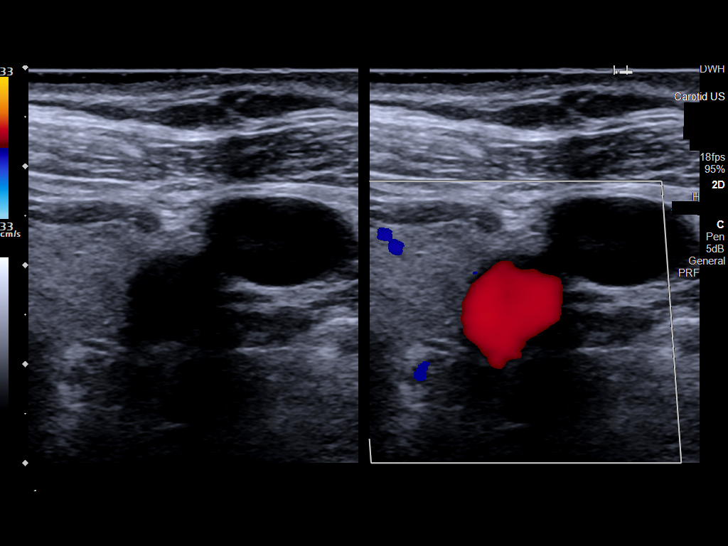
[im 38/62]
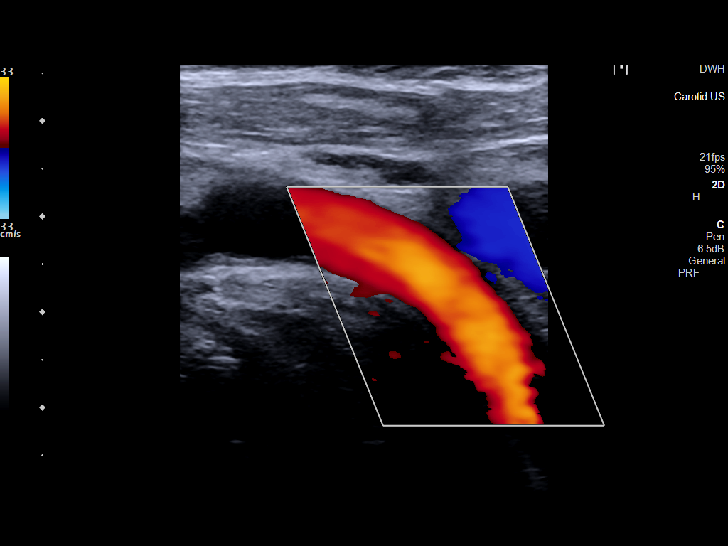
[im 43/62]
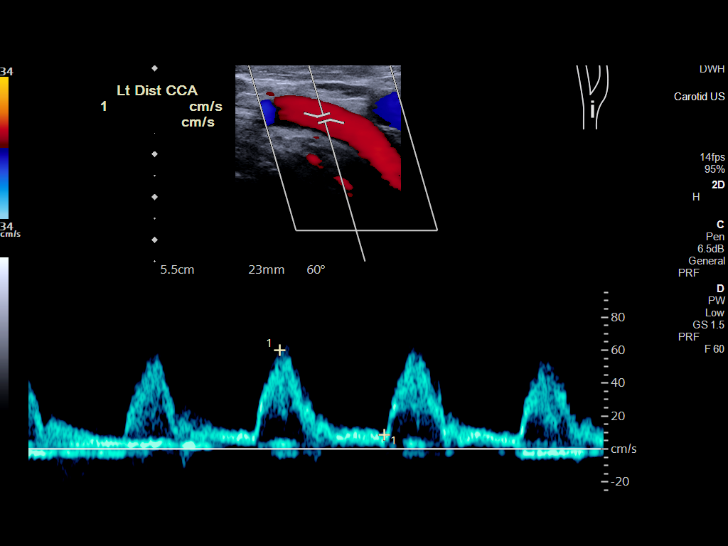
[im 48/62]
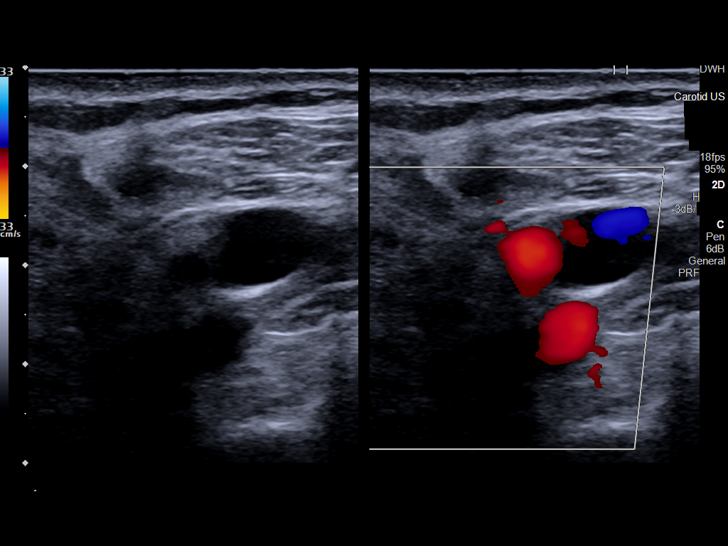
[im 51/62]
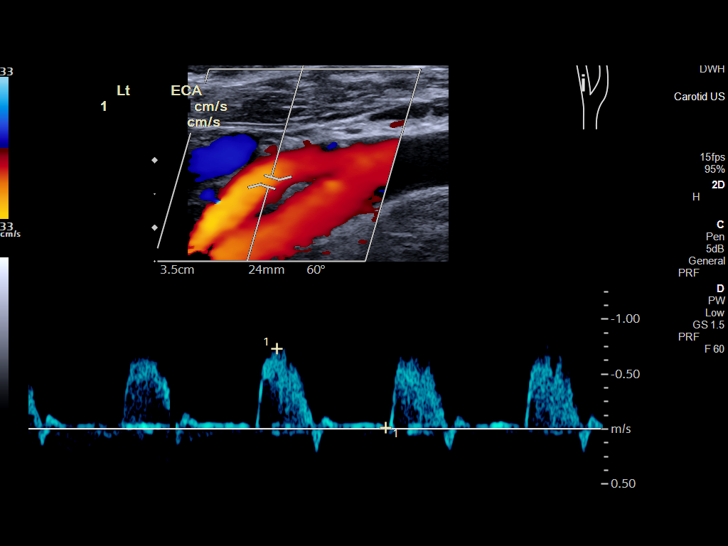
[im 56/62]
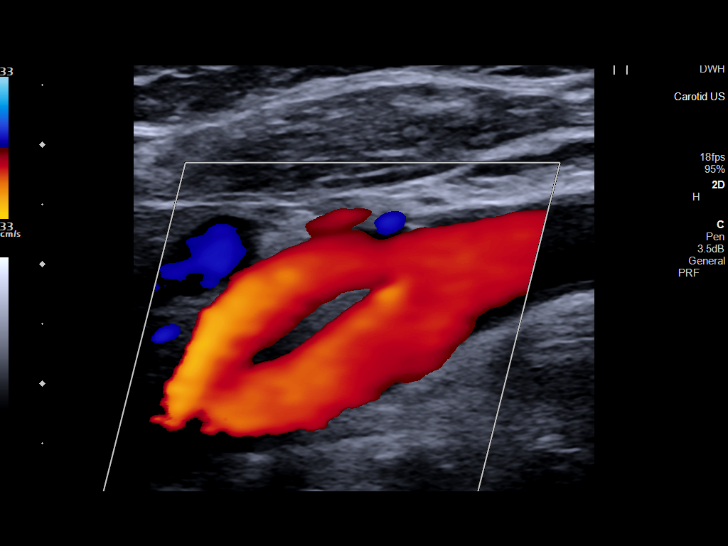
[im 62/62]
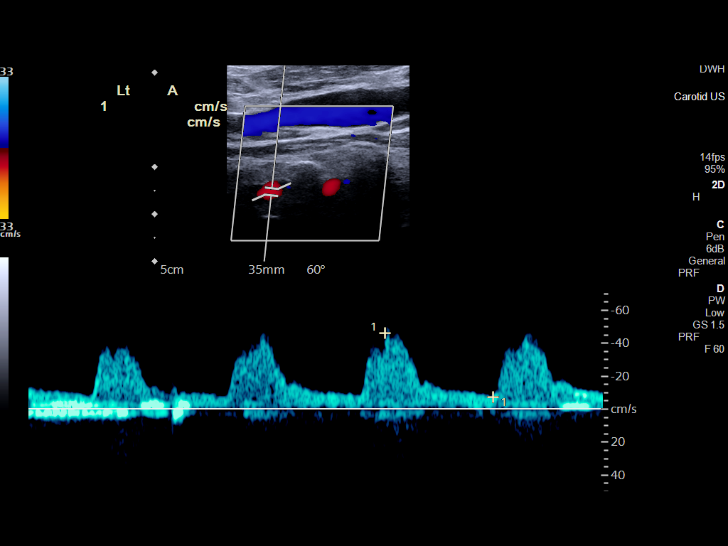

[14 of 24 positions shown; findings below may reference images not displayed]

FINDINGS: Criteria: Quantification of carotid stenosis is based on velocity
parameters that correlate the residual internal carotid diameter
with NASCET-based stenosis levels, using the diameter of the distal
internal carotid lumen as the denominator for stenosis measurement.

The following velocity measurements were obtained:

RIGHT

ICA: 61/15 cm/sec

CCA: 105/13 cm/sec

SYSTOLIC ICA/CCA RATIO:

ECA: 60 cm/sec

LEFT

ICA: 50/12 cm/sec

CCA: 60/19 cm/sec

SYSTOLIC ICA/CCA RATIO:

ECA: 73 cm/sec

RIGHT CAROTID ARTERY: No significant plaque formation or stenosis.

RIGHT VERTEBRAL ARTERY:  Antegrade flow

LEFT CAROTID ARTERY:  No significant plaque formation or stenosis.

LEFT VERTEBRAL ARTERY:  Antegrade flow
IMPRESSION: No significant plaque formation or stenosis bilaterally.

## 2023-06-15 DEATH — deceased
